# Patient Record
Sex: Female | Born: 1935 | Race: White | Hispanic: No | Marital: Married | Smoking: Former smoker
Health system: Southern US, Community
[De-identification: ages and names within clinical notes are randomized; demographics above are authoritative.]

## PROBLEM LIST (undated history)

## (undated) DIAGNOSIS — I10 Essential (primary) hypertension: Secondary | ICD-10-CM

## (undated) DIAGNOSIS — K219 Gastro-esophageal reflux disease without esophagitis: Secondary | ICD-10-CM

## (undated) DIAGNOSIS — Z0389 Encounter for observation for other suspected diseases and conditions ruled out: Secondary | ICD-10-CM

## (undated) DIAGNOSIS — E785 Hyperlipidemia, unspecified: Secondary | ICD-10-CM

## (undated) DIAGNOSIS — F32A Depression, unspecified: Secondary | ICD-10-CM

## (undated) DIAGNOSIS — I493 Ventricular premature depolarization: Secondary | ICD-10-CM

## (undated) DIAGNOSIS — E119 Type 2 diabetes mellitus without complications: Secondary | ICD-10-CM

## (undated) DIAGNOSIS — I499 Cardiac arrhythmia, unspecified: Secondary | ICD-10-CM

## (undated) DIAGNOSIS — E669 Obesity, unspecified: Secondary | ICD-10-CM

## (undated) DIAGNOSIS — R002 Palpitations: Secondary | ICD-10-CM

## (undated) HISTORY — PX: TOTAL ABDOMINAL HYSTERECTOMY: SHX209

## (undated) HISTORY — DX: Hyperlipidemia, unspecified: E78.5

## (undated) HISTORY — DX: Type 2 diabetes mellitus without complications: E11.9

## (undated) HISTORY — DX: Ventricular premature depolarization: I49.3

## (undated) HISTORY — DX: Essential (primary) hypertension: I10

## (undated) HISTORY — DX: Palpitations: R00.2

## (undated) HISTORY — DX: Encounter for observation for other suspected diseases and conditions ruled out: Z03.89

## (undated) HISTORY — DX: Obesity, unspecified: E66.9

## (undated) HISTORY — PX: CARPAL TUNNEL RELEASE: SHX101

---

## 2007-11-13 ENCOUNTER — Ambulatory Visit: Payer: Self-pay | Admitting: Cardiology

## 2007-11-13 ENCOUNTER — Encounter: Payer: Self-pay | Admitting: Cardiology

## 2007-11-19 ENCOUNTER — Encounter: Payer: Self-pay | Admitting: Cardiology

## 2007-12-15 ENCOUNTER — Encounter: Payer: Self-pay | Admitting: Cardiology

## 2007-12-31 ENCOUNTER — Encounter: Payer: Self-pay | Admitting: Physician Assistant

## 2007-12-31 ENCOUNTER — Ambulatory Visit: Payer: Self-pay | Admitting: Cardiology

## 2008-01-07 ENCOUNTER — Ambulatory Visit: Payer: Self-pay | Admitting: Cardiology

## 2008-01-15 ENCOUNTER — Ambulatory Visit: Payer: Self-pay | Admitting: Cardiology

## 2008-02-20 ENCOUNTER — Ambulatory Visit: Payer: Self-pay | Admitting: Cardiology

## 2008-10-06 ENCOUNTER — Encounter: Payer: Self-pay | Admitting: Physician Assistant

## 2008-10-06 ENCOUNTER — Ambulatory Visit: Payer: Self-pay | Admitting: Cardiology

## 2009-01-24 DIAGNOSIS — E785 Hyperlipidemia, unspecified: Secondary | ICD-10-CM | POA: Insufficient documentation

## 2009-01-24 DIAGNOSIS — R002 Palpitations: Secondary | ICD-10-CM | POA: Insufficient documentation

## 2009-01-24 DIAGNOSIS — E783 Hyperchylomicronemia: Secondary | ICD-10-CM | POA: Insufficient documentation

## 2009-03-15 ENCOUNTER — Ambulatory Visit: Payer: Self-pay | Admitting: Cardiology

## 2009-03-15 DIAGNOSIS — E119 Type 2 diabetes mellitus without complications: Secondary | ICD-10-CM

## 2009-05-13 ENCOUNTER — Encounter: Payer: Self-pay | Admitting: Cardiology

## 2010-04-21 ENCOUNTER — Encounter: Payer: Self-pay | Admitting: Cardiology

## 2010-04-21 ENCOUNTER — Ambulatory Visit: Payer: Self-pay | Admitting: Cardiology

## 2010-06-08 NOTE — Assessment & Plan Note (Signed)
Summary: 1 YR FU PER NOV REMINDER   Visit Type:  Follow-up Primary Provider:  Sherril Croon   History of Present Illness: the patient is a 77 year old female with a history of frequent palpitations. She has normal LV systolic function. She reports that her palpitations have improved with Cardizem. She denies any chest pain shortness of breath orthopnea PND. Her echocardiogram was within normal limits today. She reports no other cardiovascular related problems.  Preventive Screening-Counseling & Management  Alcohol-Tobacco     Smoking Status: quit     Year Quit: 1985  Current Medications (verified): 1)  Cardizem Cd 240 Mg Xr24h-Cap (Diltiazem Hcl Coated Beads) .... Take 1 Tablet By Mouth Once A Day 2)  Metoprolol Tartrate 100 Mg Tabs (Metoprolol Tartrate) .... Take 1 Tablet By Mouth Two Times A Day 3)  Aspirin 81 Mg Tbec (Aspirin) .... Take 1 Tablet By Mouth Once A Day 4)  Amaryl 4 Mg Tabs (Glimepiride) .... Take 1 Tablet By Mouth Two Times A Day 5)  Metformin Hcl 500 Mg Tabs (Metformin Hcl) .... Take 1 Tablet By Mouth Three Times A Day 6)  Prilosec 20 Mg Cpdr (Omeprazole) .... Take 1 Tablet By Mouth Once A Day 7)  Diovan Hct 160-12.5 Mg Tabs (Valsartan-Hydrochlorothiazide) .... Take 1 Tablet By Mouth Once A Day 8)  Gabapentin 300 Mg Caps (Gabapentin) .... Take 1 Tablet By Mouth Two Times A Day  Allergies (verified): No Known Drug Allergies  Comments:  Nurse/Medical Assistant: The patient's medication list and allergies were reviewed with the patient and were updated in the Medication and Allergy Lists.  Past History:  Past Medical History: Last updated: 03/15/2009 HYPERLIPIDEMIA-MIXED (ICD-272.4) HYPERLIPIDEMIA TYPE I / IV (ICD-272.3) PALPITATIONS (ICD-785.1)   1. Longstanding palpitations.     a.     Documented history of PACs and PVCs. 2. Normal left ventricular function. 3. Type 2 diabetes mellitus. 4. Hypertension. 5. Mixed dyslipidemia. 6. Obesity.  Family History: Last  updated: 03/15/2009 noncontributory  Social History: Last updated: 01/24/2009 Tobacco Use - Former.   Risk Factors: Smoking Status: quit (04/21/2010)  Review of Systems       The patient complains of palpitations.  The patient denies fatigue, malaise, fever, weight gain/loss, vision loss, decreased hearing, hoarseness, chest pain, shortness of breath, prolonged cough, wheezing, sleep apnea, coughing up blood, abdominal pain, blood in stool, nausea, vomiting, diarrhea, heartburn, incontinence, blood in urine, muscle weakness, joint pain, leg swelling, rash, skin lesions, headache, fainting, dizziness, depression, anxiety, enlarged lymph nodes, easy bruising or bleeding, and environmental allergies.    Vital Signs:  Patient profile:   77 year old female Height:      62 inches Weight:      197 pounds BMI:     36.16 Pulse rate:   71 / minute BP sitting:   114 / 79  (left arm) Cuff size:   large  Vitals Entered By: Carlye Grippe (April 21, 2010 10:36 AM)  Nutrition Counseling: Patient's BMI is greater than 25 and therefore counseled on weight management options.  Physical Exam  Additional Exam:  General: Well-developed, well-nourished in no distress head: Normocephalic and atraumatic eyes PERRLA/EOMI intact, conjunctiva and lids normal nose: No deformity or lesions mouth normal dentition, normal posterior pharynx neck: Supple, no JVD.  No masses, thyromegaly or abnormal cervical nodes lungs: Normal breath sounds bilaterally without wheezing.  Normal percussion heart: regular rate and rhythm with normal S1 and S2, no S3 or S4.  PMI is normal.  No pathological murmurs abdomen: Normal  bowel sounds, abdomen is soft and nontender without masses, organomegaly or hernias noted.  No hepatosplenomegaly musculoskeletal: Back normal, normal gait muscle strength and tone normal pulsus: Pulse is normal in all 4 extremities Extremities: No peripheral pitting edema neurologic: Alert and  oriented x 3 skin: Intact without lesions or rashes cervical nodes: No significant adenopathy psychologic: Normal affect    EKG  Procedure date:  04/21/2010  Findings:      normal sinus rhythm. Otherwise normal tracing. Heart rate 68 beats per minute.  Impression & Recommendations:  Problem # 1:  PALPITATIONS (ICD-785.1) improved on Cardizem. I changed this patient's Toprol to metoprolol tartrate 100 mg p.o. b.i.d. The following medications were removed from the medication list:    Metoprolol Tartrate 25 Mg Tabs (Metoprolol tartrate) .Marland Kitchen... Take one tablet by mouth as needed for palpitations (max = 2/day) Her updated medication list for this problem includes:    Cardizem Cd 240 Mg Xr24h-cap (Diltiazem hcl coated beads) .Marland Kitchen... Take 1 tablet by mouth once a day    Metoprolol Tartrate 100 Mg Tabs (Metoprolol tartrate) .Marland Kitchen... Take 1 tablet by mouth two times a day    Aspirin 81 Mg Tbec (Aspirin) .Marland Kitchen... Take 1 tablet by mouth once a day  Orders: EKG w/ Interpretation (93000)  Problem # 2:  DM (ICD-250.00) Assessment: Comment Only  Her updated medication list for this problem includes:    Aspirin 81 Mg Tbec (Aspirin) .Marland Kitchen... Take 1 tablet by mouth once a day    Amaryl 4 Mg Tabs (Glimepiride) .Marland Kitchen... Take 1 tablet by mouth two times a day    Metformin Hcl 500 Mg Tabs (Metformin hcl) .Marland Kitchen... Take 1 tablet by mouth three times a day    Diovan Hct 160-12.5 Mg Tabs (Valsartan-hydrochlorothiazide) .Marland Kitchen... Take 1 tablet by mouth once a day  Problem # 3:  HYPERLIPIDEMIA TYPE I / IV (ICD-272.3) patient is scheduled for blood work in January of 2012.  Patient Instructions: 1)  Change to Metoprolol Tartrate 100mg  two times a day  2)  Follow up in  1 year  Prescriptions: METOPROLOL TARTRATE 100 MG TABS (METOPROLOL TARTRATE) Take 1 tablet by mouth two times a day  #60 x 11   Entered by:   Hoover Brunette, LPN   Authorized by:   Lewayne Bunting, MD, [facility]   Signed by:   Hoover Brunette, LPN on 45/40/9811   Method  used:   Electronically to        [facility]* (retail)       [address]       [place], [place]  [address]       Ph: [phone]       Fax: [phone]   RxID:   [id number]

## 2010-09-19 NOTE — Assessment & Plan Note (Signed)
[facility]                          [facility]   NAME:Christine Caldwell, Christine Caldwell                        MRN:          [id number]  DATE:01/15/2008                            DOB:          02/06/1934    REFERRING PHYSICIAN:  Doreen Beam, MD   HISTORY OF PRESENT ILLNESS:  The patient is a 77 year old female with  history of palpitation, documented PACs and PVCs.  The patient has ruled  out for myocardial ischemia and has normal LV function.  She still  continues to bothered by frequent premature beats and palpitations.  She  is quite concerned and anxious about this.  She denies having any chest  pain, shortness of breath, orthopnea, or PND.  She reports no syncope.   MEDICATIONS:  1. Prilosec 20 mg p.o. daily.  2. Metformin 500 mg, 2 in the morning and 1 in the p.m.  3. Amaryl 4 mg 1 p.o. b.i.d.  4. Aspirin 81 mg p.o. daily.  5. Diovan and hydrochlorothiazide 160/12.5 mg p.o. daily.  6. Doxycycline 100 mg p.o. every other day.  7. Toprol 100 1-1/2 pills p.o. b.i.d.  8. Low-dose Cardizem CD 20 mg p.o. daily.   PHYSICAL EXAMINATION:  VITAL SIGNS:  Blood pressure 145/85, heart rate  78, weight is 197 pounds.  NECK:  Normal carotid upstroke and no carotid bruits.  LUNGS:  Clear breath sounds bilaterally.  HEART:  Regular rate and rhythm.  Normal S1 and S2.  No murmurs, rubs,  or gallops.  ABDOMEN:  Soft, nontender.  No rebound or guarding.  Good bowel sounds.  EXTREMITIES:  No cyanosis, clubbing, or edema.   PROBLEM LIST:  1. Palpitations with premature ventricular contractions and premature      atrial contractions.  2. Atypical chest pain.   PLAN:  1. I have adjusted the patient's medical regimen.  2. I reassured the patient about the benign nature of her      palpitations.  She will continue to follow with [place] in the next      couple months.Learta Codding, MD,FACC  Electronically Signed   GED/MedQ  DD: 01/16/2008  DT: 01/17/2008   Job #: [id number]   cc:   Doreen Beam, MD

## 2010-09-19 NOTE — Assessment & Plan Note (Signed)
[facility]                          [facility]   NAME:Christine Caldwell, Christine Caldwell                        MRN:          [id number]  DATE:12/31/2007                            DOB:          11/15/1933    CARDIOLOGIST:  Learta Codding, MD,FACC   PRIMARY CARE PHYSICIAN:  Doreen Beam, MD   REASON FOR VISIT:  Posthospitalization followup.   HISTORY OF PRESENT ILLNESS:  Christine Caldwell is a 77 year old female patient  with a history of palpitations and documented PAC and PVCs as well as  diabetes mellitus, hypertension, and dyslipidemia who was evaluated  recently at [facility] with chest pain in association with  chronic palpitations.  Her palpitations were seemingly getting worse.  She ruled out for myocardial infarction by enzymes.  An echocardiogram  demonstrated normal LV function and she was to be set up for an  outpatient stress test.  Unfortunately, the patient cancelled her stress  test secondary to recurrent palpitations.  She states that she did not  feel well enough to go through with a stress test.  In the office today,  she notes that she continues to have more and more problems with  palpitations.  She apparently had an event monitor placed by her primary  care physician recently.  We do not have the results of this as of yet.  She notes that her palpitations occur at any time.  She has associated  discomfort in her chest that she describes as a hurt.  There is no  radiation.  She denies any associated shortness of breath.  She does  note some nausea associated with this.  She also notes a headache and a  pounding in her head when this occurs.  She denies syncope or near  syncope.  She denies orthopnea, PND, or pedal edema.   CURRENT MEDICATIONS:  Prilosec 20 mg daily, Metformin 500 mg 2 in the  morning 1 in the evening, Amaryl 4 mg b.i.d., Aspirin 81 mg daily,  Toprol-XL 100 mg 2 tablets daily, Diovan/HCTZ 160/12.5 mg daily,  Doxycycline 100 mg every other day.   ALLERGIES:  No known drug allergies.   PHYSICAL EXAMINATION:  GENERAL:  She is a well-nourished, well-developed  female.  VITAL SIGNS:  Blood pressure is 130/84, pulse 77, and weight 196.8  pounds.  HEENT:  Normal.  NECK:  Without JVD.  CARDIAC:  Normal S1 and S2.  Regular rate and rhythm.  LUNGS:  Clear to auscultation bilaterally.  ABDOMEN:  Soft and nontender.  EXTREMITIES:  Without edema.  NEUROLOGIC:  She is alert and oriented x3.  Cranial nerves II through  XII are grossly intact.   DATABASE:  Recent labs drawn on December 15, 2007 revealed a glucose of  145, creatinine 0.8, total cholesterol 188, triglycerides 281, HDL 59,  LDL 73, TSH 2.56.  Hemoglobin 14.1.   ASSESSMENT/PLAN:  1. Chest pain.  This seems to be in association with her palpitations.      She does have significant cardiac risk factors.  Her last stress      test was in  2004, which was negative for ischemia.  I have      recommended that the patient proceed with stress Cardiolite testing      to rule out ischemia as a cause for her symptoms.  She is in      agreement with this.  2. Palpitations.  She has documented premature atrial contractions and      premature ventricular contractions in the past.  She continues to      note worsening palpitations.  We will try to obtain the results of      the event monitor recently placed by her primary care physician.  I      have also recommended increasing her Toprol to 100 mg 1-1/2 tablets      b.i.d.   DISPOSITION:  The patient will be brought back in followup with Dr.  Andee Lineman in the next several weeks to review her stress testing and also  assess her response to the increase in her medications.      Tereso Newcomer, PA-C  Electronically Signed      Jonelle Sidle, MD  Electronically Signed   SW/MedQ  DD: 12/31/2007  DT: 01/01/2008  Job #: [id number]   cc:   Doreen Beam, MD

## 2010-09-19 NOTE — Assessment & Plan Note (Signed)
[facility]                          [facility]   NAME:JOYCEMaraki, Christine Caldwell                        MRN:          [id number]  DATE:10/06/2008                            DOB:          August 24, 1933    PRIMARY CARDIOLOGIST:  Learta Codding, MD,FACC   REASON FOR VISIT:  Six-month followup.   Christine Caldwell presents for continued monitoring and management of  longstanding palpitations.  She has documented history of PACs and PVCs,  but no known atrial fibrillation.  She also has numerous cardiac risk  factors, but no documented history of coronary disease.  She had a  normal adequate exercise stress test last September.  She has normal  left ventricular function with no significant valvular abnormalities.   Clinically, she refers to having had intermittent palpitations  approximately 2 weeks ago, over the course of 4 days.  She has not had  any since then, but does cite associated weakness when she experiences  this.  Essentially, however, she suggests that the frequency is still  much less than it had been in years past.   Christine Caldwell has some mild exertional dyspnea, but no associated chest  discomfort.   EKG today indicates NSR at 60 bpm with normal axis and no ischemic  changes.   CURRENT MEDICATIONS:  1. Aspirin 81 daily.  2. Amaryl 4 b.i.d.  3. Metformin 1000 q.a.m./500 q.p.m.  4. Prilosec 20 daily.  5. Diovan HCT 160/12.5 daily.  6. Toprol-XL 100 b.i.d.  7. Cardizem 120 daily.   PHYSICAL EXAMINATION:  VITAL SIGNS:  Blood pressure 142/90, pulse 72,  regular, weight 195 (down 2).  GENERAL:  A 77 year old female, moderately obese, in no distress.  HEENT:  Normocephalic, atraumatic.  NECK:  Palpable carotid pulse without bruits; no JVD.  LUNGS:  Diminished breath sounds at the bases, but without crackles or  wheezes.  HEART:  Regular rate and rhythm.  No significant murmurs.  ABDOMEN:  Protuberant, nontender.  EXTREMITIES:  No edema.  NEURO:  Flat affect, but no focal deficit.   IMPRESSION:  1. Longstanding palpitations.      a.     Documented history of PACs and PVCs.  2. Normal left ventricular function.  3. Type 2 diabetes mellitus.  4. Hypertension.  5. Mixed dyslipidemia.  6. Obesity.   PLAN:  1. Adjust medications with up titration of Cardizem to 180 mg daily,      for better suppression of breakthrough palpitations.  We will also      add Lopressor 25 mg p.r.n.  2. Schedule return clinic followup with myself and Dr. Andee Lineman in 3      months.      Gene Serpe, PA-C  Electronically Signed      Learta Codding, MD,FACC  Electronically Signed   GS/MedQ  DD: 10/06/2008  DT: 10/07/2008  Job #: [id number]   cc:   Doreen Beam, MD

## 2010-09-19 NOTE — Assessment & Plan Note (Signed)
[facility]                          [facility]   NAME:Christine Caldwell, Christine Caldwell                        MRN:          [id number]  DATE:02/20/2008                            DOB:          02/17/1934    PRIMARY CARDIOLOGIST:  Learta Codding, MD, [facility]   REASON FOR VISIT:  Scheduled followup.  Please refer to Dr. Margarita Mail  recent office note of January 15, 2008, for full details.   At that time, Dr. Andee Lineman down titrated Toprol to the current dose of 100  mg b.i.d.  Her Cardizem dose was kept the same.   She reports no significant increase in the frequency of her palpitations  and, in fact, seems to suggest that this has happened only on rare  occasion since her last visit.  She otherwise continues to report no  chest pain.   [name] has had recent extensive workup, including a normal adequate  exercise stress test.  She has normal left ventricular function with no  significant valvular abnormalities.  She has no known history of  coronary artery disease.   CURRENT MEDICATIONS:  1. Aspirin 81 daily.  2. Toprol-XL 100 b.i.d.  3. Cardizem 120 daily.  4. Amaryl 4 b.i.d.  5. Metformin 1000 q.a.m./500 q.p.m.  6. Doxycycline 100 q.o.d.   PHYSICAL EXAMINATION:  VITAL SIGNS:  Blood pressure 120/77, pulse 69,  regular, weight 197.8.  GENERAL:  A 77 year old female, obese, sitting upright, in no distress.  HEENT:  Normocephalic, atraumatic.  NECK:  Palpable bilateral carotid pulses without bruits; no JVD.  LUNGS:  Clear to auscultation in all fields.  HEART:  Regular rate and rhythm.  No significant murmurs.  ABDOMEN:  Protuberant, nontender with intact bowel sounds.  EXTREMITIES:  No significant edema.  NEURO:  No focal deficit.   IMPRESSION:  1. Palpitations, currently quiescent.      a.     Well-controlled on beta-blocker/calcium channel blocker.      b.     Documented history of premature atrial contractions and       premature  ventricular contractions.  2. Normal LVF.  3. Atypical chest pain/multiple cardiac risk factors.      a.     No documented history of coronary artery disease.      b.     Recent normal exercise stress Cardiolite; EF 76%, September       2009.   PLAN:  1. Continue current regimen for long-term suppression of palpitations.  2. The patient is instructed to confer with Dr. Sherril Croon at time of her      next visit, regarding whether or not to initiate cholesterol-      lowering therapy.  Although she has no documented history of CAD,      she does have CAD equivalency secondary to diabetes mellitus.  3. Return clinic followup with myself and Dr. Andee Lineman in 6 months.      Gene Serpe, PA-C  Electronically Signed      Learta Codding, MD,FACC  Electronically Signed   GS/MedQ  DD: 02/20/2008  DT: 02/21/2008  Job #: [id number]   cc:   Doreen Beam, MD

## 2011-03-14 DIAGNOSIS — R079 Chest pain, unspecified: Secondary | ICD-10-CM

## 2011-03-21 ENCOUNTER — Encounter: Payer: Self-pay | Admitting: *Deleted

## 2011-03-21 DIAGNOSIS — R079 Chest pain, unspecified: Secondary | ICD-10-CM

## 2011-03-27 ENCOUNTER — Encounter: Payer: Self-pay | Admitting: Cardiology

## 2011-03-27 ENCOUNTER — Encounter: Payer: Self-pay | Admitting: *Deleted

## 2011-03-27 ENCOUNTER — Telehealth: Payer: Self-pay | Admitting: *Deleted

## 2011-03-27 NOTE — Telephone Encounter (Signed)
Pt missed appt at 10:45. She came into the office at 11:45 and states hospital told her appt was at that time. Pt's stress echo was normal. Discussed with Dr. Earnestine Leys who states pt can return for f/u in 3 months. Pt verbalized understanding.

## 2011-05-07 ENCOUNTER — Other Ambulatory Visit: Payer: Self-pay | Admitting: Cardiology

## 2011-06-07 ENCOUNTER — Other Ambulatory Visit: Payer: Self-pay | Admitting: *Deleted

## 2011-06-07 NOTE — Telephone Encounter (Signed)
Patient needs refill on Metoprolol 100 mg [employer]

## 2011-06-08 MED ORDER — METOPROLOL TARTRATE 100 MG PO TABS: 100.0000 mg | ORAL_TABLET | Freq: Two times a day (BID) | ORAL | Status: DC

## 2011-07-19 ENCOUNTER — Ambulatory Visit (INDEPENDENT_AMBULATORY_CARE_PROVIDER_SITE_OTHER): Payer: Medicare Other | Admitting: Cardiology

## 2011-07-19 ENCOUNTER — Encounter: Payer: Self-pay | Admitting: Cardiology

## 2011-07-19 VITALS — BP 122/64 | HR 68 | Ht 62.0 in | Wt 194.0 lb

## 2011-07-19 DIAGNOSIS — Z0389 Encounter for observation for other suspected diseases and conditions ruled out: Secondary | ICD-10-CM | POA: Insufficient documentation

## 2011-07-19 DIAGNOSIS — I4949 Other premature depolarization: Secondary | ICD-10-CM

## 2011-07-19 DIAGNOSIS — I493 Ventricular premature depolarization: Secondary | ICD-10-CM

## 2011-07-19 MED ORDER — METOPROLOL TARTRATE 100 MG PO TABS: 100.0000 mg | ORAL_TABLET | Freq: Two times a day (BID) | ORAL | Status: DC

## 2011-07-19 MED ORDER — DILTIAZEM HCL ER COATED BEADS 240 MG PO CP24: 240.0000 mg | ORAL_CAPSULE | Freq: Every day | ORAL | Status: AC

## 2011-07-19 NOTE — Patient Instructions (Addendum)
Continue all current medications. Your physician wants you to follow up in:  1 year.  You will receive a reminder letter in the mail one-two months in advance.  If you don't receive a letter, please call our office to schedule the follow up appointment   

## 2011-07-19 NOTE — Progress Notes (Signed)
Christine Bottoms, MD, [facility] Board Certified in Adult Cardiovascular Medicine,Internal Medicine and Critical Care Medicine    CC: Followup patient with palpitations  HPI:  Patient has no history of coronary artery disease. She had a stress test done last year which was within normal limits hospitalized. She still has occasional palpitations but they're short lived and she has not required any medications to abort these. She is essentially asymptomatic with those. She has no significant structural heart disease. She denies any orthopnea PND, chest pain or shortness of breath on exertion. He stable from a cardiovascular perspective.  PMH: reviewed and listed in Problem List in Electronic Records (and see below) Past Medical History  Diagnosis Date  . Other and unspecified hyperlipidemia   . Hyperlipidemia     Total cholesterol 190  . Palpitations     . Longstanding palpitations.  Documented history of PACs and PVCs.  . Type 2 diabetes mellitus   . Hypertension   . Obesity   . Coronary artery disease (CAD) excluded     Myoview stress test 2012 within normal limits, normal LV function echocardiogram  . PVC's (premature ventricular contractions)      stable   No past surgical history on file.  Allergies/SH/FHX : available in Electronic Records for review  No Known Allergies History   Social History  . Marital Status: Married    Spouse Name: N/A    Number of Children: N/A  . Years of Education: N/A   Occupational History  . Not on file.   Social History Main Topics  . Smoking status: Former Smoker -- 0.3 packs/day for 10 years    Types: Cigarettes    Quit date: 05/08/1983  . Smokeless tobacco: Never Used  . Alcohol Use: Not on file  . Drug Use: Not on file  . Sexually Active: Not on file   Other Topics Concern  . Not on file   Social History Narrative  . No narrative on file   No family history on file.  Medications: Current Outpatient Prescriptions    Medication Sig Dispense Refill  . aspirin EC 81 MG tablet Take 81 mg by mouth daily.        Marland Kitchen diltiazem (CARDIZEM CD) 240 MG 24 hr capsule Take 1 capsule (240 mg total) by mouth daily.  90 capsule  3  . gabapentin (NEURONTIN) 300 MG capsule Take 300 mg by mouth daily.       Marland Kitchen glimepiride (AMARYL) 4 MG tablet Take 4 mg by mouth 2 (two) times daily.        . metFORMIN (GLUCOPHAGE) 500 MG tablet Take 1,000 mg by mouth daily with breakfast. & 1 by mouth in evening      . metoprolol (LOPRESSOR) 100 MG tablet Take 1 tablet (100 mg total) by mouth 2 (two) times daily.  180 tablet  3  . omeprazole (PRILOSEC) 20 MG capsule Take 20 mg by mouth daily.        . valsartan-hydrochlorothiazide (DIOVAN-HCT) 160-12.5 MG per tablet Take 1 tablet by mouth daily.          ROS: No nausea or vomiting. No fever or chills.No melena or hematochezia.No bleeding.No claudication  Physical Exam: BP 122/64  Pulse 68  Ht 5\' 2"  (1.575 m)  Wt 194 lb (87.998 kg)  BMI 35.48 kg/m2 General: Well-nourished white female in no distress Neck: Normal carotid upstroke and no carotid bruits Lungs: Clear breath sounds bilaterally. No wheezing Cardiac: Regular rate and rhythm with  normal S1-S2. No murmur rubs or gallops Vascular: No edema. Normal distal pulses Skin: Warm and dry Physcologic: Normal affect  12lead ECG: Limited bedside ECHO:N/A No images are attached to the encounter.    Patient Active Problem List  Diagnoses  . DM  . HYPERLIPIDEMIA TYPE I / IV  . HYPERLIPIDEMIA-MIXED  . PALPITATIONS  . Coronary artery disease (CAD) excluded  . PVC's (premature ventricular contractions)    PLAN   Continue current medical regimen. The patient is doing quite well.  She had a stress test done last year which was within normal limits. She reports no chest pain.  Continued to remain physically active and achieved a therapeutic lifestyle changes.

## 2012-02-13 ENCOUNTER — Ambulatory Visit (INDEPENDENT_AMBULATORY_CARE_PROVIDER_SITE_OTHER): Payer: Medicare Other | Admitting: Physician Assistant

## 2012-02-13 ENCOUNTER — Telehealth: Payer: Self-pay | Admitting: Cardiology

## 2012-02-13 ENCOUNTER — Encounter: Payer: Self-pay | Admitting: Physician Assistant

## 2012-02-13 VITALS — BP 108/70 | HR 63 | Ht 62.0 in | Wt 196.8 lb

## 2012-02-13 DIAGNOSIS — E119 Type 2 diabetes mellitus without complications: Secondary | ICD-10-CM

## 2012-02-13 DIAGNOSIS — R002 Palpitations: Secondary | ICD-10-CM

## 2012-02-13 DIAGNOSIS — I10 Essential (primary) hypertension: Secondary | ICD-10-CM | POA: Insufficient documentation

## 2012-02-13 NOTE — Telephone Encounter (Signed)
Will be following [facility] after today

## 2012-02-13 NOTE — Assessment & Plan Note (Signed)
Followed by primary M.D. 

## 2012-02-13 NOTE — Patient Instructions (Signed)
   7 Day heart monitor - will be mailed to your home  Labs:  BMET, Magnesium, TSH  Office will contact with results Continue all current medications. Follow up in  2 weeks

## 2012-02-13 NOTE — Assessment & Plan Note (Signed)
Currently low normal, despite her not having taken any of her antihypertensives this morning. Will make further recommendations at time of next OV, following review of monitor results.

## 2012-02-13 NOTE — Assessment & Plan Note (Signed)
Patient has agreed to wear a continuous event monitor for one week, followed by early return visit with me for review of results and further recommendations. Regarding her current medication regimen, I am unable to up titrate either the Cardizem or metoprolol, secondary to relative hypotension. Of note, patient states that she did not take her medications this a.m. If necessary, we may need to cut back on her valsartan dose, so as to allow increase in either of her 2 rate controlling medications.

## 2012-02-13 NOTE — Progress Notes (Signed)
Primary Cardiologist: Lewayne Bunting, MD   HPI: Patient seen as an add-on for worsening palpitations. Last seen here in clinic in March, by Dr. Andee Lineman.  Patient suggests recent progression of long-standing history of palpitations, with history of previously documented PACs and PVCs. These occur near daily, and are associated with significant weakness, but no frank syncope. She can have several episodes a day, sometimes lasting several hours in duration.  12-lead EKG today, reviewed by me, indicates NSR 63 bpm; no ischemic changes.  No Known Allergies  Current Outpatient Prescriptions  Medication Sig Dispense Refill  . aspirin EC 81 MG tablet Take 81 mg by mouth daily.        Marland Kitchen diltiazem (CARDIZEM CD) 240 MG 24 hr capsule Take 1 capsule (240 mg total) by mouth daily.  90 capsule  3  . gabapentin (NEURONTIN) 300 MG capsule Take 300 mg by mouth daily.       Marland Kitchen glimepiride (AMARYL) 4 MG tablet Take 4 mg by mouth 2 (two) times daily.        . metFORMIN (GLUCOPHAGE) 500 MG tablet Take 500 mg by mouth 2 (two) times daily with a meal. & 1 by mouth in evening      . metoprolol (LOPRESSOR) 100 MG tablet Take 1 tablet (100 mg total) by mouth 2 (two) times daily.  180 tablet  3  . omeprazole (PRILOSEC) 20 MG capsule Take 20 mg by mouth daily.        . ONGLYZA 5 MG TABS tablet Take 5 mg by mouth daily.       . valsartan-hydrochlorothiazide (DIOVAN-HCT) 160-12.5 MG per tablet Take 1 tablet by mouth daily.          Past Medical History  Diagnosis Date  . Other and unspecified hyperlipidemia   . Hyperlipidemia     Total cholesterol 190  . Palpitations     . Longstanding palpitations.  Documented history of PACs and PVCs.  . Type 2 diabetes mellitus   . Hypertension   . Obesity   . Coronary artery disease (CAD) excluded     Myoview stress test 2012 within normal limits, normal LV function echocardiogram  . PVC's (premature ventricular contractions)      stable    No past surgical history on  file.  History   Social History  . Marital Status: Married    Spouse Name: N/A    Number of Children: N/A  . Years of Education: N/A   Occupational History  . Not on file.   Social History Main Topics  . Smoking status: Former Smoker -- 0.3 packs/day for 10 years    Types: Cigarettes    Quit date: 05/08/1983  . Smokeless tobacco: Never Used  . Alcohol Use: Not on file  . Drug Use: Not on file  . Sexually Active: Not on file   Other Topics Concern  . Not on file   Social History Narrative  . No narrative on file    No family history on file.  ROS: no nausea, vomiting; no fever, chills; no melena, hematochezia; no claudication  PHYSICAL EXAM: BP 108/70  Pulse 63  Ht 5\' 2"  (1.575 m)  Wt 196 lb 12.8 oz (89.268 kg)  BMI 36.00 kg/m2 GENERAL: 78 year old female, obese; NAD HEENT: NCAT, PERRLA, EOMI; sclera clear; no xanthelasma NECK: palpable bilateral carotid pulses, no bruits; unable to assess JVD, secondary to neck girth; no TM LUNGS: CTA bilaterally CARDIAC: RRR (S1, S2); no significant murmurs; no rubs or gallops  ABDOMEN: Protuberant EXTREMETIES: no significant peripheral edema SKIN: warm/dry; no obvious rash/lesions MUSCULOSKELETAL: no joint deformity NEURO: no focal deficit; NL affect   EKG: reviewed and available in Electronic Records   ASSESSMENT & PLAN:  PALPITATIONS Patient has agreed to wear a continuous event monitor for one week, followed by early return visit with me for review of results and further recommendations. Regarding her current medication regimen, I am unable to up titrate either the Cardizem or metoprolol, secondary to relative hypotension. Of note, patient states that she did not take her medications this a.m. If necessary, we may need to cut back on her valsartan dose, so as to allow increase in either of her 2 rate controlling medications.  HTN (hypertension) Currently low normal, despite her not having taken any of her  antihypertensives this morning. Will make further recommendations at time of next OV, following review of monitor results.  DM Followed by primary M.D.    Gene Naveen Lorusso, PAC

## 2012-02-14 ENCOUNTER — Other Ambulatory Visit: Payer: Self-pay | Admitting: *Deleted

## 2012-02-14 DIAGNOSIS — R002 Palpitations: Secondary | ICD-10-CM

## 2012-02-20 DIAGNOSIS — R002 Palpitations: Secondary | ICD-10-CM

## 2012-02-22 ENCOUNTER — Encounter: Payer: Self-pay | Admitting: *Deleted

## 2012-03-14 ENCOUNTER — Ambulatory Visit: Payer: Medicare Other | Admitting: Physician Assistant

## 2012-03-21 ENCOUNTER — Encounter: Payer: Self-pay | Admitting: Physician Assistant

## 2012-03-21 ENCOUNTER — Ambulatory Visit (INDEPENDENT_AMBULATORY_CARE_PROVIDER_SITE_OTHER): Payer: Medicare Other | Admitting: Physician Assistant

## 2012-03-21 VITALS — BP 130/80 | HR 73 | Ht 62.0 in | Wt 199.8 lb

## 2012-03-21 DIAGNOSIS — I10 Essential (primary) hypertension: Secondary | ICD-10-CM

## 2012-03-21 DIAGNOSIS — R002 Palpitations: Secondary | ICD-10-CM

## 2012-03-21 NOTE — Patient Instructions (Addendum)
Continue all current medications. Your physician wants you to follow up in: 6 months.  You will receive a reminder letter in the mail one-two months in advance.  If you don't receive a letter, please call our office to schedule the follow up appointment   

## 2012-03-21 NOTE — Progress Notes (Signed)
Primary Cardiologist: Lewayne Bunting, MD   HPI: Patient presents for early scheduled followup and review of recent seven-day Event monitor, for ongoing evaluation of palpitations.   - Event monitor: NSR, no definite atrial fibrillation   - Labs notable for normal potassium, magnesium, and TSH  Clinically, she reports no significant palpitations since last OV, and during the period she wore the event monitor.  No Known Allergies  Current Outpatient Prescriptions  Medication Sig Dispense Refill  . aspirin EC 81 MG tablet Take 81 mg by mouth daily.        Marland Kitchen diltiazem (CARDIZEM CD) 240 MG 24 hr capsule Take 1 capsule (240 mg total) by mouth daily.  90 capsule  3  . gabapentin (NEURONTIN) 300 MG capsule Take 300 mg by mouth daily.       Marland Kitchen glimepiride (AMARYL) 4 MG tablet Take 4 mg by mouth 2 (two) times daily.        . metFORMIN (GLUCOPHAGE) 500 MG tablet Take 500 mg by mouth 2 (two) times daily with a meal. & 1 by mouth in evening      . metoprolol (LOPRESSOR) 100 MG tablet Take 1 tablet (100 mg total) by mouth 2 (two) times daily.  180 tablet  3  . omeprazole (PRILOSEC) 20 MG capsule Take 20 mg by mouth daily.        . ONGLYZA 5 MG TABS tablet Take 5 mg by mouth daily.       . valsartan-hydrochlorothiazide (DIOVAN-HCT) 160-12.5 MG per tablet Take 1 tablet by mouth daily.          Past Medical History  Diagnosis Date  . Other and unspecified hyperlipidemia   . Hyperlipidemia     Total cholesterol 190  . Palpitations     . Longstanding palpitations.  Documented history of PACs and PVCs.  . Type 2 diabetes mellitus   . Hypertension   . Obesity   . Coronary artery disease (CAD) excluded     Myoview stress test 2012 within normal limits, normal LV function echocardiogram  . PVC's (premature ventricular contractions)      stable    No past surgical history on file.  History   Social History  . Marital Status: Married    Spouse Name: N/A    Number of Children: N/A  . Years of  Education: N/A   Occupational History  . Not on file.   Social History Main Topics  . Smoking status: Former Smoker -- 0.3 packs/day for 10 years    Types: Cigarettes    Quit date: 05/08/1983  . Smokeless tobacco: Never Used  . Alcohol Use: Not on file  . Drug Use: Not on file  . Sexually Active: Not on file   Other Topics Concern  . Not on file   Social History Narrative  . No narrative on file    No family history on file.  ROS: no nausea, vomiting; no fever, chills; no melena, hematochezia; no claudication  PHYSICAL EXAM: BP 130/80  Pulse 73  Ht 5\' 2"  (1.575 m)  Wt 199 lb 12.8 oz (90.629 kg)  BMI 36.54 kg/m2  SpO2 98% GENERAL: 78 year old female, obese; NAD  HEENT: NCAT, PERRLA, EOMI; sclera clear; no xanthelasma  NECK: palpable bilateral carotid pulses, no bruits; unable to assess JVD, secondary to neck girth; no TM  LUNGS: CTA bilaterally  CARDIAC: RRR (S1, S2); no significant murmurs; no rubs or gallops  ABDOMEN: Protuberant  EXTREMETIES: no significant peripheral edema  SKIN: warm/dry;  no obvious rash/lesions  MUSCULOSKELETAL: no joint deformity  NEURO: no focal deficit; NL affect    EKG:    ASSESSMENT & PLAN:  PALPITATIONS No further workup indicated. Patient has expressed her desire to resume followup with Dr. Andee Lineman.  HTN (hypertension) Followed by primary M.D.    Gene Tarrin Menn, PAC

## 2012-03-21 NOTE — Assessment & Plan Note (Signed)
Followed by primary M.D. 

## 2012-03-21 NOTE — Assessment & Plan Note (Signed)
No further workup indicated. Patient has expressed her desire to resume followup with Dr. Andee Lineman.

## 2012-09-30 ENCOUNTER — Other Ambulatory Visit: Payer: Self-pay | Admitting: Cardiology

## 2012-10-09 ENCOUNTER — Ambulatory Visit (INDEPENDENT_AMBULATORY_CARE_PROVIDER_SITE_OTHER): Payer: Medicare Other | Admitting: Physician Assistant

## 2012-10-09 ENCOUNTER — Encounter: Payer: Self-pay | Admitting: Physician Assistant

## 2012-10-09 VITALS — BP 123/78 | HR 75 | Ht 62.0 in | Wt 198.0 lb

## 2012-10-09 DIAGNOSIS — R002 Palpitations: Secondary | ICD-10-CM

## 2012-10-09 DIAGNOSIS — R0989 Other specified symptoms and signs involving the circulatory and respiratory systems: Secondary | ICD-10-CM

## 2012-10-09 MED ORDER — METOPROLOL TARTRATE 100 MG PO TABS: 100.0000 mg | ORAL_TABLET | Freq: Two times a day (BID) | ORAL | Status: DC

## 2012-10-09 NOTE — Assessment & Plan Note (Signed)
No further workup currently indicated. Patient has no known CAD, with prior history of multiple negative stress tests, most recently a NL dobutamine stress echocardiogram in November 2012. She presents with no exacerbation from her baseline exercise tolerance level. Will plan for her to return in one year, or sooner if she were to develop worsening DOE or worsening tachycardia palpitations.

## 2012-10-09 NOTE — Assessment & Plan Note (Signed)
Very well controlled on current medication regimen. In fact, she states that this is the best she has felt "in years". When last seen, we reviewed a 1 week event monitor which yielded NSR with no definite atrial fibrillation. She has a long-standing history of palpitations, with previously documented PACs/PVCs, but no definite atrial fibrillation.

## 2012-10-09 NOTE — Patient Instructions (Addendum)
Continue all current medications. Your physician wants you to follow up in:  1 year.  You will receive a reminder letter in the mail one-two months in advance.  If you don't receive a letter, please call our office to schedule the follow up appointment   

## 2012-10-09 NOTE — Progress Notes (Signed)
Primary Cardiologist: Simona Huh, MD (new)   HPI: 79 year old female, formally followed by Dr. Andee Lineman and last seen here in clinic in November 2013, now returns for scheduled six-month followup.   Cardiac history notable for long-standing palpitations with documented PACs/PVCs, but no atrial fibrillation. She also has multiple CRFs, with no documented CAD, and a NL Myoview in 2012.  When last seen, we discussed the results of a one week event monitor, placed for ongoing evaluation of palpitations, which yielded NSR with no definite evidence of atrial fibrillation. Electrolytes and TSH were NL.  She returns today reporting that this is the best she has felt "in years". She now is experiencing palpitations only on rare occasions. She continues to remain quite active, climbing the stairs in her basement several times a day, with no associated chest discomfort. She has mild, stable DOE, with no interim exacerbation.  No Known Allergies  Current Outpatient Prescriptions  Medication Sig Dispense Refill  . aspirin EC 81 MG tablet Take 81 mg by mouth daily.        Marland Kitchen diltiazem (CARDIZEM CD) 240 MG 24 hr capsule Take 1 capsule (240 mg total) by mouth daily.  90 capsule  3  . gabapentin (NEURONTIN) 300 MG capsule Take 300 mg by mouth daily.       Marland Kitchen glimepiride (AMARYL) 4 MG tablet Take 4 mg by mouth 2 (two) times daily.        . metFORMIN (GLUCOPHAGE) 500 MG tablet Take 500 mg by mouth 2 (two) times daily with a meal. & 1 by mouth in evening      . metoprolol (LOPRESSOR) 100 MG tablet TAKE (1) TABLET TWICE DAILY.  60 tablet  0  . omeprazole (PRILOSEC) 20 MG capsule Take 20 mg by mouth daily.        . ONGLYZA 5 MG TABS tablet Take 5 mg by mouth daily.       . valsartan-hydrochlorothiazide (DIOVAN-HCT) 160-12.5 MG per tablet Take 1 tablet by mouth daily.         No current facility-administered medications for this visit.    Past Medical History  Diagnosis Date  . Other and unspecified  hyperlipidemia   . Hyperlipidemia     Total cholesterol 190  . Palpitations     . Longstanding palpitations.  Documented history of PACs and PVCs.  . Type 2 diabetes mellitus   . Hypertension   . Obesity   . Coronary artery disease (CAD) excluded     Myoview stress test 2012 within normal limits, normal LV function echocardiogram  . PVC's (premature ventricular contractions)      stable    No past surgical history on file.  History   Social History  . Marital Status: Married    Spouse Name: N/A    Number of Children: N/A  . Years of Education: N/A   Occupational History  . Not on file.   Social History Main Topics  . Smoking status: Former Smoker -- 0.30 packs/day for 10 years    Types: Cigarettes    Quit date: 05/08/1983  . Smokeless tobacco: Never Used  . Alcohol Use: Not on file  . Drug Use: Not on file  . Sexually Active: Not on file   Other Topics Concern  . Not on file   Social History Narrative  . No narrative on file    No family history on file.  ROS: no nausea, vomiting; no fever, chills; no melena, hematochezia; no claudication  PHYSICAL EXAM:  BP 123/78  Pulse 75  Ht 5\' 2"  (1.575 m)  Wt 198 lb (89.812 kg)  BMI 36.21 kg/m2 GENERAL: 79 year old female, moderately obese; NAD HEENT: NCAT, PERRLA, EOMI; sclera clear; no xanthelasma NECK: palpable bilateral carotid pulses, no bruits; no JVD; no TM LUNGS: CTA bilaterally CARDIAC: RRR (S1, S2); no significant murmurs; no rubs or gallops ABDOMEN: soft, protuberant EXTREMETIES: no significant peripheral edema SKIN: warm/dry; no obvious rash/lesions MUSCULOSKELETAL: no joint deformity NEURO: no focal deficit; NL affect   EKG:    ASSESSMENT & PLAN:  Palpitations Very well controlled on current medication regimen. In fact, she states that this is the best she has felt "in years". When last seen, we reviewed a 1 week event monitor which yielded NSR with no definite atrial fibrillation. She has a  long-standing history of palpitations, with previously documented PACs/PVCs, but no definite atrial fibrillation.  Exertional dyspnea No further workup currently indicated. Patient has no known CAD, with prior history of multiple negative stress tests, most recently a NL dobutamine stress echocardiogram in November 2012. She presents with no exacerbation from her baseline exercise tolerance level. Will plan for her to return in one year, or sooner if she were to develop worsening DOE or worsening tachycardia palpitations.    Gene Cairo Lingenfelter, PAC

## 2012-11-01 ENCOUNTER — Other Ambulatory Visit: Payer: Self-pay | Admitting: Physician Assistant

## 2016-11-09 ENCOUNTER — Encounter: Payer: Self-pay | Admitting: *Deleted

## 2016-11-13 ENCOUNTER — Encounter: Payer: Self-pay | Admitting: Cardiovascular Disease

## 2016-11-13 ENCOUNTER — Ambulatory Visit (INDEPENDENT_AMBULATORY_CARE_PROVIDER_SITE_OTHER): Payer: Medicare Other | Admitting: Cardiovascular Disease

## 2016-11-13 VITALS — BP 91/63 | HR 77 | Ht 62.0 in | Wt 179.4 lb

## 2016-11-13 DIAGNOSIS — R002 Palpitations: Secondary | ICD-10-CM | POA: Diagnosis not present

## 2016-11-13 DIAGNOSIS — Z79899 Other long term (current) drug therapy: Secondary | ICD-10-CM | POA: Diagnosis not present

## 2016-11-13 DIAGNOSIS — I10 Essential (primary) hypertension: Secondary | ICD-10-CM | POA: Diagnosis not present

## 2016-11-13 NOTE — Progress Notes (Signed)
CARDIOLOGY CONSULT NOTE  Patient ID: Christine Caldwell MRN: [id number] DOB/AGE: [birth year] 83 y.o.  Admit date: (Not on file) Primary Physician: Christine Chroman, MD Referring Physician: Woody Caldwell  Reason for Consultation: palpitations  HPI: Christine Caldwell is a 83 y.o. female who is being seen today for the evaluation of palpitations at the request of Vyas, Dhruv B, MD.   She was last evaluated by cardiology in June 2014. As per review electronic medical record, she has a long-standing history of palpitations with documented PACs and PVCs but no atrial fibrillation. She had a normal dobutamine nuclear stress test in 2012.  ECG performed in the office today which I ordered and personally interpreted demonstrates normal sinus rhythm with no ischemic ST segment or T-wave abnormalities, nor any arrhythmias.  The patient denies any symptoms of chest pain, palpitations, shortness of breath, lightheadedness, dizziness, leg swelling, orthopnea, PND, and syncope.  She does not do any exercising but would like to start walking.  She comes today to discuss her medications and whether she can come off any of them.     No Known Allergies  Current Outpatient Prescriptions  Medication Sig Dispense Refill  . aspirin EC 81 MG tablet Take 81 mg by mouth daily.      Marland Kitchen diltiazem (CARDIZEM CD) 240 MG 24 hr capsule Take 1 capsule (240 mg total) by mouth daily. 90 capsule 3  . gabapentin (NEURONTIN) 300 MG capsule Take 300 mg by mouth daily.     Marland Kitchen glimepiride (AMARYL) 4 MG tablet Take 4 mg by mouth 2 (two) times daily.      . metFORMIN (GLUCOPHAGE) 500 MG tablet Take 500 mg by mouth 2 (two) times daily with a meal. & 1 by mouth in evening    . metoprolol (LOPRESSOR) 100 MG tablet Take 1 tablet (100 mg total) by mouth 2 (two) times daily. 180 tablet 3  . metoprolol (LOPRESSOR) 100 MG tablet TAKE 1 TABLET TWICE DAILY. MUST KEEP APPT. ON 10/09/12 60 tablet 6  . omeprazole (PRILOSEC) 20 MG capsule Take 20  mg by mouth daily.      . ONGLYZA 5 MG TABS tablet Take 5 mg by mouth daily.     . valsartan-hydrochlorothiazide (DIOVAN-HCT) 160-12.5 MG per tablet Take 1 tablet by mouth daily.       No current facility-administered medications for this visit.     Past Medical History:  Diagnosis Date  . Coronary artery disease (CAD) excluded    Myoview stress test 2012 within normal limits, normal LV function echocardiogram  . Hyperlipidemia    Total cholesterol 190  . Hypertension   . Obesity   . Other and unspecified hyperlipidemia   . Palpitations    . Longstanding palpitations.  Documented history of PACs and PVCs.  . PVC's (premature ventricular contractions)     stable  . Type 2 diabetes mellitus ([place])     Past Surgical History:  Procedure Laterality Date  . TOTAL ABDOMINAL HYSTERECTOMY      Social History   Social History  . Marital status: Married    Spouse name: N/A  . Number of children: N/A  . Years of education: N/A   Occupational History  . Not on file.   Social History Main Topics  . Smoking status: Former Smoker    Packs/day: 0.30    Years: 10.00    Types: Cigarettes    Quit date: 05/08/1983  . Smokeless tobacco: Never Used  .  Alcohol use Not on file  . Drug use: Unknown  . Sexual activity: Not on file   Other Topics Concern  . Not on file   Social History Narrative  . No narrative on file     No family history of premature CAD in 1st degree relatives.  No outpatient prescriptions have been marked as taking for the 11/13/16 encounter (Office Visit) with Christine Commons, MD.      Review of systems complete and found to be negative unless listed above in HPI    Physical exam Blood pressure 91/63, pulse 77, height 5\' 2"  (1.575 m), weight 179 lb 6.4 oz (81.4 kg). General: NAD Neck: No JVD, no thyromegaly or thyroid nodule.  Lungs: Clear to auscultation bilaterally with normal respiratory effort. CV: Nondisplaced PMI. Regular rate and rhythm,  normal S1/S2, no S3/S4, no murmur.  No peripheral edema.  No carotid bruit.    Abdomen: Soft, nontender, no distention.  Skin: Intact without lesions or rashes.  Neurologic: Alert and oriented x 3.  Psych: Normal affect. Extremities: No clubbing or cyanosis.  HEENT: Normal.   ECG: Most recent ECG reviewed.   Labs: No results found for: K, BUN, CREATININE, ALT, TSH, HGB   Lipids: No results found for: LDLCALC, LDLDIRECT, CHOL, TRIG, HDL      ASSESSMENT AND PLAN:  1. Palpitations: She is on long-acting diltiazem 240 mg daily and metoprolol tartrate 100 mg twice daily. I will stop metoprolol altogether. I told her it would be normal to expect some palpitations over the next several days but this should resolve as metoprolol is excreted from her system. Diltiazem dose can be reduced in the future but I will defer this to PCP.  2. Hypertension: Low normal on present therapy.  I will stop metoprolol altogether.     Disposition: Follow up prn  Signed: Kate Caldwell, M.D., F.A.C.C.  11/13/2016, 2:44 PM

## 2016-11-13 NOTE — Patient Instructions (Signed)
Your physician recommends that you schedule a follow-up appointment in: AS NEEDED WITH DR Krakow physician has recommended you make the following change in your medication:   STOP METOPROLOL   Thank you for choosing [facility]!!

## 2016-11-26 ENCOUNTER — Telehealth: Payer: Self-pay | Admitting: Cardiovascular Disease

## 2016-11-26 DIAGNOSIS — R002 Palpitations: Secondary | ICD-10-CM

## 2016-11-26 NOTE — Telephone Encounter (Signed)
Patient states since coming off metoprolol she has been very weak and unable to perform  ADL's without becoming extremely weak and short of breath. Getting up to answer the phone, patient could barely catch her breath. Symptoms started about a week ago. No other symptoms. Advised patient if symptoms worsen she needs to go to the ER.

## 2016-11-26 NOTE — Telephone Encounter (Signed)
Patient called stating that she stopped taking metoprolol (LOPRESSOR) 100 MG ta  on July 11th. Since then she is so fatigue ( no energy)   Please call [phone].

## 2016-11-27 NOTE — Telephone Encounter (Signed)
LM to return call.

## 2016-11-27 NOTE — Telephone Encounter (Signed)
Stopping metoprolol would not cause these symptoms unless she was having a significant abnormal heart rhythm. She needs to contact her pcp for evaluation. From our standpoint I would recommend a 1 week event monitor for palpitations.    Carlyle Dolly MD

## 2016-11-28 NOTE — Telephone Encounter (Signed)
Pt agreeable to wear event monitor - says she wants to remain off the metoprolol - orders placed and enrolled pt - updated medication list

## 2016-12-03 ENCOUNTER — Ambulatory Visit (INDEPENDENT_AMBULATORY_CARE_PROVIDER_SITE_OTHER): Payer: Medicare Other

## 2016-12-03 DIAGNOSIS — R002 Palpitations: Secondary | ICD-10-CM | POA: Diagnosis not present

## 2016-12-21 ENCOUNTER — Telehealth: Payer: Self-pay | Admitting: *Deleted

## 2016-12-21 NOTE — Telephone Encounter (Signed)
-----   Message from Lendon Colonel, NP sent at 12/21/2016  7:54 AM EDT ----- Monitor did not show significant abnormalities. No changes in regimen.

## 2016-12-21 NOTE — Telephone Encounter (Signed)
Called patient with test results. No answer. Left message to call back.  

## 2019-05-11 DIAGNOSIS — Z299 Encounter for prophylactic measures, unspecified: Secondary | ICD-10-CM | POA: Diagnosis not present

## 2019-05-11 DIAGNOSIS — E113292 Type 2 diabetes mellitus with mild nonproliferative diabetic retinopathy without macular edema, left eye: Secondary | ICD-10-CM | POA: Diagnosis not present

## 2019-05-11 DIAGNOSIS — I10 Essential (primary) hypertension: Secondary | ICD-10-CM | POA: Diagnosis not present

## 2019-05-11 DIAGNOSIS — E1165 Type 2 diabetes mellitus with hyperglycemia: Secondary | ICD-10-CM | POA: Diagnosis not present

## 2019-05-28 DIAGNOSIS — W19XXXA Unspecified fall, initial encounter: Secondary | ICD-10-CM | POA: Diagnosis not present

## 2019-05-28 DIAGNOSIS — M545 Low back pain: Secondary | ICD-10-CM | POA: Diagnosis not present

## 2019-06-01 DIAGNOSIS — E119 Type 2 diabetes mellitus without complications: Secondary | ICD-10-CM | POA: Diagnosis not present

## 2019-06-19 DIAGNOSIS — E7800 Pure hypercholesterolemia, unspecified: Secondary | ICD-10-CM | POA: Diagnosis not present

## 2019-06-19 DIAGNOSIS — E119 Type 2 diabetes mellitus without complications: Secondary | ICD-10-CM | POA: Diagnosis not present

## 2019-06-19 DIAGNOSIS — I10 Essential (primary) hypertension: Secondary | ICD-10-CM | POA: Diagnosis not present

## 2019-07-05 DIAGNOSIS — E119 Type 2 diabetes mellitus without complications: Secondary | ICD-10-CM | POA: Diagnosis not present

## 2019-07-16 DIAGNOSIS — E7800 Pure hypercholesterolemia, unspecified: Secondary | ICD-10-CM | POA: Diagnosis not present

## 2019-07-16 DIAGNOSIS — E119 Type 2 diabetes mellitus without complications: Secondary | ICD-10-CM | POA: Diagnosis not present

## 2019-07-16 DIAGNOSIS — I10 Essential (primary) hypertension: Secondary | ICD-10-CM | POA: Diagnosis not present

## 2019-08-04 DIAGNOSIS — E119 Type 2 diabetes mellitus without complications: Secondary | ICD-10-CM | POA: Diagnosis not present

## 2019-08-18 DIAGNOSIS — E113292 Type 2 diabetes mellitus with mild nonproliferative diabetic retinopathy without macular edema, left eye: Secondary | ICD-10-CM | POA: Diagnosis not present

## 2019-08-18 DIAGNOSIS — E1122 Type 2 diabetes mellitus with diabetic chronic kidney disease: Secondary | ICD-10-CM | POA: Diagnosis not present

## 2019-08-18 DIAGNOSIS — Z299 Encounter for prophylactic measures, unspecified: Secondary | ICD-10-CM | POA: Diagnosis not present

## 2019-08-18 DIAGNOSIS — E1165 Type 2 diabetes mellitus with hyperglycemia: Secondary | ICD-10-CM | POA: Diagnosis not present

## 2019-08-18 DIAGNOSIS — I10 Essential (primary) hypertension: Secondary | ICD-10-CM | POA: Diagnosis not present

## 2019-08-19 DIAGNOSIS — E7800 Pure hypercholesterolemia, unspecified: Secondary | ICD-10-CM | POA: Diagnosis not present

## 2019-08-19 DIAGNOSIS — I10 Essential (primary) hypertension: Secondary | ICD-10-CM | POA: Diagnosis not present

## 2019-08-19 DIAGNOSIS — E119 Type 2 diabetes mellitus without complications: Secondary | ICD-10-CM | POA: Diagnosis not present

## 2019-08-23 DIAGNOSIS — R070 Pain in throat: Secondary | ICD-10-CM | POA: Diagnosis not present

## 2019-08-23 DIAGNOSIS — Z7982 Long term (current) use of aspirin: Secondary | ICD-10-CM | POA: Diagnosis not present

## 2019-08-23 DIAGNOSIS — R05 Cough: Secondary | ICD-10-CM | POA: Diagnosis not present

## 2019-08-23 DIAGNOSIS — Z743 Need for continuous supervision: Secondary | ICD-10-CM | POA: Diagnosis not present

## 2019-08-23 DIAGNOSIS — E119 Type 2 diabetes mellitus without complications: Secondary | ICD-10-CM | POA: Diagnosis not present

## 2019-08-23 DIAGNOSIS — R5381 Other malaise: Secondary | ICD-10-CM | POA: Diagnosis not present

## 2019-08-23 DIAGNOSIS — Z79899 Other long term (current) drug therapy: Secondary | ICD-10-CM | POA: Diagnosis not present

## 2019-08-23 DIAGNOSIS — R404 Transient alteration of awareness: Secondary | ICD-10-CM | POA: Diagnosis not present

## 2019-08-23 DIAGNOSIS — I10 Essential (primary) hypertension: Secondary | ICD-10-CM | POA: Diagnosis not present

## 2019-08-23 DIAGNOSIS — J209 Acute bronchitis, unspecified: Secondary | ICD-10-CM | POA: Diagnosis not present

## 2019-08-23 DIAGNOSIS — K219 Gastro-esophageal reflux disease without esophagitis: Secondary | ICD-10-CM | POA: Diagnosis not present

## 2019-08-24 DIAGNOSIS — N183 Chronic kidney disease, stage 3 unspecified: Secondary | ICD-10-CM | POA: Diagnosis not present

## 2019-08-24 DIAGNOSIS — D692 Other nonthrombocytopenic purpura: Secondary | ICD-10-CM | POA: Diagnosis not present

## 2019-08-24 DIAGNOSIS — I10 Essential (primary) hypertension: Secondary | ICD-10-CM | POA: Diagnosis not present

## 2019-08-24 DIAGNOSIS — J209 Acute bronchitis, unspecified: Secondary | ICD-10-CM | POA: Diagnosis not present

## 2019-08-24 DIAGNOSIS — Z299 Encounter for prophylactic measures, unspecified: Secondary | ICD-10-CM | POA: Diagnosis not present

## 2019-08-25 DIAGNOSIS — R05 Cough: Secondary | ICD-10-CM | POA: Diagnosis not present

## 2019-08-25 DIAGNOSIS — Z8709 Personal history of other diseases of the respiratory system: Secondary | ICD-10-CM | POA: Diagnosis not present

## 2019-08-29 DIAGNOSIS — R404 Transient alteration of awareness: Secondary | ICD-10-CM | POA: Diagnosis not present

## 2019-08-29 DIAGNOSIS — E162 Hypoglycemia, unspecified: Secondary | ICD-10-CM | POA: Diagnosis not present

## 2019-08-29 DIAGNOSIS — R4020 Unspecified coma: Secondary | ICD-10-CM | POA: Diagnosis not present

## 2019-08-29 DIAGNOSIS — E161 Other hypoglycemia: Secondary | ICD-10-CM | POA: Diagnosis not present

## 2019-08-29 DIAGNOSIS — E1165 Type 2 diabetes mellitus with hyperglycemia: Secondary | ICD-10-CM | POA: Diagnosis not present

## 2019-09-01 DIAGNOSIS — N1831 Chronic kidney disease, stage 3a: Secondary | ICD-10-CM | POA: Diagnosis not present

## 2019-09-01 DIAGNOSIS — I10 Essential (primary) hypertension: Secondary | ICD-10-CM | POA: Diagnosis not present

## 2019-09-01 DIAGNOSIS — Z79899 Other long term (current) drug therapy: Secondary | ICD-10-CM | POA: Diagnosis not present

## 2019-09-01 DIAGNOSIS — R05 Cough: Secondary | ICD-10-CM | POA: Diagnosis not present

## 2019-09-01 DIAGNOSIS — K219 Gastro-esophageal reflux disease without esophagitis: Secondary | ICD-10-CM | POA: Diagnosis not present

## 2019-09-01 DIAGNOSIS — J40 Bronchitis, not specified as acute or chronic: Secondary | ICD-10-CM | POA: Diagnosis not present

## 2019-09-01 DIAGNOSIS — E119 Type 2 diabetes mellitus without complications: Secondary | ICD-10-CM | POA: Diagnosis not present

## 2019-09-01 DIAGNOSIS — N390 Urinary tract infection, site not specified: Secondary | ICD-10-CM | POA: Diagnosis not present

## 2019-09-01 DIAGNOSIS — N289 Disorder of kidney and ureter, unspecified: Secondary | ICD-10-CM | POA: Diagnosis not present

## 2019-09-01 DIAGNOSIS — Z299 Encounter for prophylactic measures, unspecified: Secondary | ICD-10-CM | POA: Diagnosis not present

## 2019-09-01 DIAGNOSIS — E860 Dehydration: Secondary | ICD-10-CM | POA: Diagnosis not present

## 2019-09-01 DIAGNOSIS — Z87891 Personal history of nicotine dependence: Secondary | ICD-10-CM | POA: Diagnosis not present

## 2019-09-01 DIAGNOSIS — Z7982 Long term (current) use of aspirin: Secondary | ICD-10-CM | POA: Diagnosis not present

## 2019-09-01 DIAGNOSIS — E1165 Type 2 diabetes mellitus with hyperglycemia: Secondary | ICD-10-CM | POA: Diagnosis not present

## 2019-09-01 DIAGNOSIS — J209 Acute bronchitis, unspecified: Secondary | ICD-10-CM | POA: Diagnosis not present

## 2019-09-04 DIAGNOSIS — E119 Type 2 diabetes mellitus without complications: Secondary | ICD-10-CM | POA: Diagnosis not present

## 2019-09-05 DIAGNOSIS — E1122 Type 2 diabetes mellitus with diabetic chronic kidney disease: Secondary | ICD-10-CM | POA: Diagnosis not present

## 2019-09-05 DIAGNOSIS — R531 Weakness: Secondary | ICD-10-CM | POA: Diagnosis not present

## 2019-09-05 DIAGNOSIS — E538 Deficiency of other specified B group vitamins: Secondary | ICD-10-CM | POA: Diagnosis not present

## 2019-09-05 DIAGNOSIS — Z20822 Contact with and (suspected) exposure to covid-19: Secondary | ICD-10-CM | POA: Diagnosis not present

## 2019-09-05 DIAGNOSIS — R1110 Vomiting, unspecified: Secondary | ICD-10-CM | POA: Diagnosis not present

## 2019-09-05 DIAGNOSIS — E1140 Type 2 diabetes mellitus with diabetic neuropathy, unspecified: Secondary | ICD-10-CM | POA: Diagnosis not present

## 2019-09-05 DIAGNOSIS — N200 Calculus of kidney: Secondary | ICD-10-CM | POA: Diagnosis not present

## 2019-09-05 DIAGNOSIS — N183 Chronic kidney disease, stage 3 unspecified: Secondary | ICD-10-CM | POA: Diagnosis not present

## 2019-09-05 DIAGNOSIS — Z87891 Personal history of nicotine dependence: Secondary | ICD-10-CM | POA: Diagnosis not present

## 2019-09-05 DIAGNOSIS — J209 Acute bronchitis, unspecified: Secondary | ICD-10-CM | POA: Diagnosis not present

## 2019-09-05 DIAGNOSIS — E860 Dehydration: Secondary | ICD-10-CM | POA: Diagnosis not present

## 2019-09-05 DIAGNOSIS — R410 Disorientation, unspecified: Secondary | ICD-10-CM | POA: Diagnosis not present

## 2019-09-05 DIAGNOSIS — E876 Hypokalemia: Secondary | ICD-10-CM | POA: Diagnosis not present

## 2019-09-05 DIAGNOSIS — D649 Anemia, unspecified: Secondary | ICD-10-CM | POA: Diagnosis not present

## 2019-09-05 DIAGNOSIS — I700 Atherosclerosis of aorta: Secondary | ICD-10-CM | POA: Diagnosis not present

## 2019-09-05 DIAGNOSIS — K449 Diaphragmatic hernia without obstruction or gangrene: Secondary | ICD-10-CM | POA: Diagnosis not present

## 2019-09-05 DIAGNOSIS — Z7984 Long term (current) use of oral hypoglycemic drugs: Secondary | ICD-10-CM | POA: Diagnosis not present

## 2019-09-05 DIAGNOSIS — K219 Gastro-esophageal reflux disease without esophagitis: Secondary | ICD-10-CM | POA: Diagnosis not present

## 2019-09-05 DIAGNOSIS — Z7982 Long term (current) use of aspirin: Secondary | ICD-10-CM | POA: Diagnosis not present

## 2019-09-05 DIAGNOSIS — I129 Hypertensive chronic kidney disease with stage 1 through stage 4 chronic kidney disease, or unspecified chronic kidney disease: Secondary | ICD-10-CM | POA: Diagnosis not present

## 2019-09-05 DIAGNOSIS — K8020 Calculus of gallbladder without cholecystitis without obstruction: Secondary | ICD-10-CM | POA: Diagnosis not present

## 2019-09-05 DIAGNOSIS — N179 Acute kidney failure, unspecified: Secondary | ICD-10-CM | POA: Diagnosis not present

## 2019-09-10 DIAGNOSIS — Z299 Encounter for prophylactic measures, unspecified: Secondary | ICD-10-CM | POA: Diagnosis not present

## 2019-09-10 DIAGNOSIS — R110 Nausea: Secondary | ICD-10-CM | POA: Diagnosis not present

## 2019-09-10 DIAGNOSIS — R413 Other amnesia: Secondary | ICD-10-CM | POA: Diagnosis not present

## 2019-09-10 DIAGNOSIS — I10 Essential (primary) hypertension: Secondary | ICD-10-CM | POA: Diagnosis not present

## 2019-09-10 DIAGNOSIS — E113292 Type 2 diabetes mellitus with mild nonproliferative diabetic retinopathy without macular edema, left eye: Secondary | ICD-10-CM | POA: Diagnosis not present

## 2019-09-10 DIAGNOSIS — E1165 Type 2 diabetes mellitus with hyperglycemia: Secondary | ICD-10-CM | POA: Diagnosis not present

## 2019-09-15 DIAGNOSIS — I10 Essential (primary) hypertension: Secondary | ICD-10-CM | POA: Diagnosis not present

## 2019-09-15 DIAGNOSIS — E538 Deficiency of other specified B group vitamins: Secondary | ICD-10-CM | POA: Diagnosis not present

## 2019-09-15 DIAGNOSIS — Z299 Encounter for prophylactic measures, unspecified: Secondary | ICD-10-CM | POA: Diagnosis not present

## 2019-09-15 DIAGNOSIS — E1165 Type 2 diabetes mellitus with hyperglycemia: Secondary | ICD-10-CM | POA: Diagnosis not present

## 2019-09-15 DIAGNOSIS — E113292 Type 2 diabetes mellitus with mild nonproliferative diabetic retinopathy without macular edema, left eye: Secondary | ICD-10-CM | POA: Diagnosis not present

## 2019-09-16 DIAGNOSIS — E113292 Type 2 diabetes mellitus with mild nonproliferative diabetic retinopathy without macular edema, left eye: Secondary | ICD-10-CM | POA: Diagnosis not present

## 2019-09-16 DIAGNOSIS — E538 Deficiency of other specified B group vitamins: Secondary | ICD-10-CM | POA: Diagnosis not present

## 2019-09-16 DIAGNOSIS — Z299 Encounter for prophylactic measures, unspecified: Secondary | ICD-10-CM | POA: Diagnosis not present

## 2019-09-16 DIAGNOSIS — H6120 Impacted cerumen, unspecified ear: Secondary | ICD-10-CM | POA: Diagnosis not present

## 2019-09-16 DIAGNOSIS — E1165 Type 2 diabetes mellitus with hyperglycemia: Secondary | ICD-10-CM | POA: Diagnosis not present

## 2019-09-16 DIAGNOSIS — I10 Essential (primary) hypertension: Secondary | ICD-10-CM | POA: Diagnosis not present

## 2019-09-17 DIAGNOSIS — I10 Essential (primary) hypertension: Secondary | ICD-10-CM | POA: Diagnosis not present

## 2019-09-17 DIAGNOSIS — E538 Deficiency of other specified B group vitamins: Secondary | ICD-10-CM | POA: Diagnosis not present

## 2019-09-17 DIAGNOSIS — R413 Other amnesia: Secondary | ICD-10-CM | POA: Diagnosis not present

## 2019-09-17 DIAGNOSIS — Z299 Encounter for prophylactic measures, unspecified: Secondary | ICD-10-CM | POA: Diagnosis not present

## 2019-09-23 DIAGNOSIS — Z299 Encounter for prophylactic measures, unspecified: Secondary | ICD-10-CM | POA: Diagnosis not present

## 2019-09-23 DIAGNOSIS — E113292 Type 2 diabetes mellitus with mild nonproliferative diabetic retinopathy without macular edema, left eye: Secondary | ICD-10-CM | POA: Diagnosis not present

## 2019-09-23 DIAGNOSIS — E538 Deficiency of other specified B group vitamins: Secondary | ICD-10-CM | POA: Diagnosis not present

## 2019-09-23 DIAGNOSIS — E1165 Type 2 diabetes mellitus with hyperglycemia: Secondary | ICD-10-CM | POA: Diagnosis not present

## 2019-09-23 DIAGNOSIS — I10 Essential (primary) hypertension: Secondary | ICD-10-CM | POA: Diagnosis not present

## 2019-09-29 DIAGNOSIS — Z1211 Encounter for screening for malignant neoplasm of colon: Secondary | ICD-10-CM | POA: Diagnosis not present

## 2019-09-29 DIAGNOSIS — Z7189 Other specified counseling: Secondary | ICD-10-CM | POA: Diagnosis not present

## 2019-09-29 DIAGNOSIS — E1165 Type 2 diabetes mellitus with hyperglycemia: Secondary | ICD-10-CM | POA: Diagnosis not present

## 2019-09-29 DIAGNOSIS — E538 Deficiency of other specified B group vitamins: Secondary | ICD-10-CM | POA: Diagnosis not present

## 2019-09-29 DIAGNOSIS — Z0000 Encounter for general adult medical examination without abnormal findings: Secondary | ICD-10-CM | POA: Diagnosis not present

## 2019-09-29 DIAGNOSIS — I10 Essential (primary) hypertension: Secondary | ICD-10-CM | POA: Diagnosis not present

## 2019-09-29 DIAGNOSIS — Z299 Encounter for prophylactic measures, unspecified: Secondary | ICD-10-CM | POA: Diagnosis not present

## 2019-10-01 DIAGNOSIS — E7800 Pure hypercholesterolemia, unspecified: Secondary | ICD-10-CM | POA: Diagnosis not present

## 2019-10-01 DIAGNOSIS — E113292 Type 2 diabetes mellitus with mild nonproliferative diabetic retinopathy without macular edema, left eye: Secondary | ICD-10-CM | POA: Diagnosis not present

## 2019-10-01 DIAGNOSIS — Z299 Encounter for prophylactic measures, unspecified: Secondary | ICD-10-CM | POA: Diagnosis not present

## 2019-10-01 DIAGNOSIS — Z79899 Other long term (current) drug therapy: Secondary | ICD-10-CM | POA: Diagnosis not present

## 2019-10-01 DIAGNOSIS — R413 Other amnesia: Secondary | ICD-10-CM | POA: Diagnosis not present

## 2019-10-01 DIAGNOSIS — R5383 Other fatigue: Secondary | ICD-10-CM | POA: Diagnosis not present

## 2019-10-01 DIAGNOSIS — I10 Essential (primary) hypertension: Secondary | ICD-10-CM | POA: Diagnosis not present

## 2019-10-01 DIAGNOSIS — E1165 Type 2 diabetes mellitus with hyperglycemia: Secondary | ICD-10-CM | POA: Diagnosis not present

## 2019-10-01 DIAGNOSIS — E538 Deficiency of other specified B group vitamins: Secondary | ICD-10-CM | POA: Diagnosis not present

## 2019-10-04 DIAGNOSIS — I10 Essential (primary) hypertension: Secondary | ICD-10-CM | POA: Diagnosis not present

## 2019-10-04 DIAGNOSIS — E7800 Pure hypercholesterolemia, unspecified: Secondary | ICD-10-CM | POA: Diagnosis not present

## 2019-10-04 DIAGNOSIS — E119 Type 2 diabetes mellitus without complications: Secondary | ICD-10-CM | POA: Diagnosis not present

## 2019-10-07 DIAGNOSIS — E1165 Type 2 diabetes mellitus with hyperglycemia: Secondary | ICD-10-CM | POA: Diagnosis not present

## 2019-10-07 DIAGNOSIS — Z299 Encounter for prophylactic measures, unspecified: Secondary | ICD-10-CM | POA: Diagnosis not present

## 2019-10-07 DIAGNOSIS — N183 Chronic kidney disease, stage 3 unspecified: Secondary | ICD-10-CM | POA: Diagnosis not present

## 2019-10-07 DIAGNOSIS — E538 Deficiency of other specified B group vitamins: Secondary | ICD-10-CM | POA: Diagnosis not present

## 2019-10-16 DIAGNOSIS — D649 Anemia, unspecified: Secondary | ICD-10-CM | POA: Diagnosis not present

## 2019-10-16 DIAGNOSIS — E119 Type 2 diabetes mellitus without complications: Secondary | ICD-10-CM | POA: Diagnosis not present

## 2019-10-16 DIAGNOSIS — E1142 Type 2 diabetes mellitus with diabetic polyneuropathy: Secondary | ICD-10-CM | POA: Diagnosis not present

## 2019-10-16 DIAGNOSIS — E1165 Type 2 diabetes mellitus with hyperglycemia: Secondary | ICD-10-CM | POA: Diagnosis not present

## 2019-10-16 DIAGNOSIS — I10 Essential (primary) hypertension: Secondary | ICD-10-CM | POA: Diagnosis not present

## 2019-10-16 DIAGNOSIS — E1122 Type 2 diabetes mellitus with diabetic chronic kidney disease: Secondary | ICD-10-CM | POA: Diagnosis not present

## 2019-10-16 DIAGNOSIS — E538 Deficiency of other specified B group vitamins: Secondary | ICD-10-CM | POA: Diagnosis not present

## 2019-10-16 DIAGNOSIS — Z299 Encounter for prophylactic measures, unspecified: Secondary | ICD-10-CM | POA: Diagnosis not present

## 2019-11-04 DIAGNOSIS — E119 Type 2 diabetes mellitus without complications: Secondary | ICD-10-CM | POA: Diagnosis not present

## 2019-11-04 DIAGNOSIS — I10 Essential (primary) hypertension: Secondary | ICD-10-CM | POA: Diagnosis not present

## 2019-11-04 DIAGNOSIS — E7800 Pure hypercholesterolemia, unspecified: Secondary | ICD-10-CM | POA: Diagnosis not present

## 2019-11-25 DIAGNOSIS — E538 Deficiency of other specified B group vitamins: Secondary | ICD-10-CM | POA: Diagnosis not present

## 2019-11-25 DIAGNOSIS — E1122 Type 2 diabetes mellitus with diabetic chronic kidney disease: Secondary | ICD-10-CM | POA: Diagnosis not present

## 2019-11-25 DIAGNOSIS — E1142 Type 2 diabetes mellitus with diabetic polyneuropathy: Secondary | ICD-10-CM | POA: Diagnosis not present

## 2019-11-25 DIAGNOSIS — I10 Essential (primary) hypertension: Secondary | ICD-10-CM | POA: Diagnosis not present

## 2019-11-25 DIAGNOSIS — Z299 Encounter for prophylactic measures, unspecified: Secondary | ICD-10-CM | POA: Diagnosis not present

## 2019-11-25 DIAGNOSIS — E1165 Type 2 diabetes mellitus with hyperglycemia: Secondary | ICD-10-CM | POA: Diagnosis not present

## 2019-11-25 DIAGNOSIS — E113292 Type 2 diabetes mellitus with mild nonproliferative diabetic retinopathy without macular edema, left eye: Secondary | ICD-10-CM | POA: Diagnosis not present

## 2019-12-04 DIAGNOSIS — E119 Type 2 diabetes mellitus without complications: Secondary | ICD-10-CM | POA: Diagnosis not present

## 2019-12-04 DIAGNOSIS — I10 Essential (primary) hypertension: Secondary | ICD-10-CM | POA: Diagnosis not present

## 2019-12-04 DIAGNOSIS — E7800 Pure hypercholesterolemia, unspecified: Secondary | ICD-10-CM | POA: Diagnosis not present

## 2019-12-22 DIAGNOSIS — R21 Rash and other nonspecific skin eruption: Secondary | ICD-10-CM | POA: Diagnosis not present

## 2019-12-22 DIAGNOSIS — E113292 Type 2 diabetes mellitus with mild nonproliferative diabetic retinopathy without macular edema, left eye: Secondary | ICD-10-CM | POA: Diagnosis not present

## 2019-12-22 DIAGNOSIS — Z299 Encounter for prophylactic measures, unspecified: Secondary | ICD-10-CM | POA: Diagnosis not present

## 2019-12-22 DIAGNOSIS — E538 Deficiency of other specified B group vitamins: Secondary | ICD-10-CM | POA: Diagnosis not present

## 2019-12-22 DIAGNOSIS — I10 Essential (primary) hypertension: Secondary | ICD-10-CM | POA: Diagnosis not present

## 2019-12-30 ENCOUNTER — Encounter (INDEPENDENT_AMBULATORY_CARE_PROVIDER_SITE_OTHER): Payer: Self-pay | Admitting: Gastroenterology

## 2020-01-05 DIAGNOSIS — Z299 Encounter for prophylactic measures, unspecified: Secondary | ICD-10-CM | POA: Diagnosis not present

## 2020-01-05 DIAGNOSIS — E1165 Type 2 diabetes mellitus with hyperglycemia: Secondary | ICD-10-CM | POA: Diagnosis not present

## 2020-01-05 DIAGNOSIS — E119 Type 2 diabetes mellitus without complications: Secondary | ICD-10-CM | POA: Diagnosis not present

## 2020-01-05 DIAGNOSIS — E113292 Type 2 diabetes mellitus with mild nonproliferative diabetic retinopathy without macular edema, left eye: Secondary | ICD-10-CM | POA: Diagnosis not present

## 2020-01-05 DIAGNOSIS — D649 Anemia, unspecified: Secondary | ICD-10-CM | POA: Diagnosis not present

## 2020-01-05 DIAGNOSIS — I10 Essential (primary) hypertension: Secondary | ICD-10-CM | POA: Diagnosis not present

## 2020-01-18 ENCOUNTER — Ambulatory Visit (INDEPENDENT_AMBULATORY_CARE_PROVIDER_SITE_OTHER): Payer: Medicare Other | Admitting: Gastroenterology

## 2020-01-22 DIAGNOSIS — E538 Deficiency of other specified B group vitamins: Secondary | ICD-10-CM | POA: Diagnosis not present

## 2020-01-22 DIAGNOSIS — I10 Essential (primary) hypertension: Secondary | ICD-10-CM | POA: Diagnosis not present

## 2020-01-22 DIAGNOSIS — E113292 Type 2 diabetes mellitus with mild nonproliferative diabetic retinopathy without macular edema, left eye: Secondary | ICD-10-CM | POA: Diagnosis not present

## 2020-01-22 DIAGNOSIS — E1165 Type 2 diabetes mellitus with hyperglycemia: Secondary | ICD-10-CM | POA: Diagnosis not present

## 2020-01-22 DIAGNOSIS — Z299 Encounter for prophylactic measures, unspecified: Secondary | ICD-10-CM | POA: Diagnosis not present

## 2020-02-04 DIAGNOSIS — I10 Essential (primary) hypertension: Secondary | ICD-10-CM | POA: Diagnosis not present

## 2020-02-04 DIAGNOSIS — E7800 Pure hypercholesterolemia, unspecified: Secondary | ICD-10-CM | POA: Diagnosis not present

## 2020-02-04 DIAGNOSIS — E119 Type 2 diabetes mellitus without complications: Secondary | ICD-10-CM | POA: Diagnosis not present

## 2020-02-22 DIAGNOSIS — Z299 Encounter for prophylactic measures, unspecified: Secondary | ICD-10-CM | POA: Diagnosis not present

## 2020-02-22 DIAGNOSIS — E538 Deficiency of other specified B group vitamins: Secondary | ICD-10-CM | POA: Diagnosis not present

## 2020-03-04 DIAGNOSIS — I10 Essential (primary) hypertension: Secondary | ICD-10-CM | POA: Diagnosis not present

## 2020-03-04 DIAGNOSIS — E7800 Pure hypercholesterolemia, unspecified: Secondary | ICD-10-CM | POA: Diagnosis not present

## 2020-03-04 DIAGNOSIS — E119 Type 2 diabetes mellitus without complications: Secondary | ICD-10-CM | POA: Diagnosis not present

## 2020-03-05 DIAGNOSIS — E119 Type 2 diabetes mellitus without complications: Secondary | ICD-10-CM | POA: Diagnosis not present

## 2020-03-07 DIAGNOSIS — E1142 Type 2 diabetes mellitus with diabetic polyneuropathy: Secondary | ICD-10-CM | POA: Diagnosis not present

## 2020-03-07 DIAGNOSIS — E1122 Type 2 diabetes mellitus with diabetic chronic kidney disease: Secondary | ICD-10-CM | POA: Diagnosis not present

## 2020-03-07 DIAGNOSIS — I10 Essential (primary) hypertension: Secondary | ICD-10-CM | POA: Diagnosis not present

## 2020-03-07 DIAGNOSIS — E1165 Type 2 diabetes mellitus with hyperglycemia: Secondary | ICD-10-CM | POA: Diagnosis not present

## 2020-03-07 DIAGNOSIS — Z299 Encounter for prophylactic measures, unspecified: Secondary | ICD-10-CM | POA: Diagnosis not present

## 2020-03-07 DIAGNOSIS — E113292 Type 2 diabetes mellitus with mild nonproliferative diabetic retinopathy without macular edema, left eye: Secondary | ICD-10-CM | POA: Diagnosis not present

## 2020-03-25 DIAGNOSIS — E1165 Type 2 diabetes mellitus with hyperglycemia: Secondary | ICD-10-CM | POA: Diagnosis not present

## 2020-03-25 DIAGNOSIS — Z299 Encounter for prophylactic measures, unspecified: Secondary | ICD-10-CM | POA: Diagnosis not present

## 2020-03-25 DIAGNOSIS — I10 Essential (primary) hypertension: Secondary | ICD-10-CM | POA: Diagnosis not present

## 2020-03-25 DIAGNOSIS — E1122 Type 2 diabetes mellitus with diabetic chronic kidney disease: Secondary | ICD-10-CM | POA: Diagnosis not present

## 2020-03-25 DIAGNOSIS — E538 Deficiency of other specified B group vitamins: Secondary | ICD-10-CM | POA: Diagnosis not present

## 2020-03-25 DIAGNOSIS — E113292 Type 2 diabetes mellitus with mild nonproliferative diabetic retinopathy without macular edema, left eye: Secondary | ICD-10-CM | POA: Diagnosis not present

## 2020-04-05 DIAGNOSIS — E7800 Pure hypercholesterolemia, unspecified: Secondary | ICD-10-CM | POA: Diagnosis not present

## 2020-04-05 DIAGNOSIS — I10 Essential (primary) hypertension: Secondary | ICD-10-CM | POA: Diagnosis not present

## 2020-04-05 DIAGNOSIS — E119 Type 2 diabetes mellitus without complications: Secondary | ICD-10-CM | POA: Diagnosis not present

## 2020-04-25 DIAGNOSIS — R5383 Other fatigue: Secondary | ICD-10-CM | POA: Diagnosis not present

## 2020-05-05 DIAGNOSIS — E119 Type 2 diabetes mellitus without complications: Secondary | ICD-10-CM | POA: Diagnosis not present

## 2020-05-05 DIAGNOSIS — I10 Essential (primary) hypertension: Secondary | ICD-10-CM | POA: Diagnosis not present

## 2020-05-05 DIAGNOSIS — E7800 Pure hypercholesterolemia, unspecified: Secondary | ICD-10-CM | POA: Diagnosis not present

## 2020-05-26 DIAGNOSIS — E538 Deficiency of other specified B group vitamins: Secondary | ICD-10-CM | POA: Diagnosis not present

## 2020-06-06 DIAGNOSIS — E119 Type 2 diabetes mellitus without complications: Secondary | ICD-10-CM | POA: Diagnosis not present

## 2020-06-13 DIAGNOSIS — N183 Chronic kidney disease, stage 3 unspecified: Secondary | ICD-10-CM | POA: Diagnosis not present

## 2020-06-13 DIAGNOSIS — E1142 Type 2 diabetes mellitus with diabetic polyneuropathy: Secondary | ICD-10-CM | POA: Diagnosis not present

## 2020-06-13 DIAGNOSIS — Z299 Encounter for prophylactic measures, unspecified: Secondary | ICD-10-CM | POA: Diagnosis not present

## 2020-06-13 DIAGNOSIS — I10 Essential (primary) hypertension: Secondary | ICD-10-CM | POA: Diagnosis not present

## 2020-06-13 DIAGNOSIS — E1165 Type 2 diabetes mellitus with hyperglycemia: Secondary | ICD-10-CM | POA: Diagnosis not present

## 2020-06-13 DIAGNOSIS — E1122 Type 2 diabetes mellitus with diabetic chronic kidney disease: Secondary | ICD-10-CM | POA: Diagnosis not present

## 2020-06-27 DIAGNOSIS — E538 Deficiency of other specified B group vitamins: Secondary | ICD-10-CM | POA: Diagnosis not present

## 2020-07-04 DIAGNOSIS — E119 Type 2 diabetes mellitus without complications: Secondary | ICD-10-CM | POA: Diagnosis not present

## 2020-07-25 DIAGNOSIS — E538 Deficiency of other specified B group vitamins: Secondary | ICD-10-CM | POA: Diagnosis not present

## 2020-07-29 DIAGNOSIS — M79662 Pain in left lower leg: Secondary | ICD-10-CM | POA: Diagnosis not present

## 2020-08-01 DIAGNOSIS — M7989 Other specified soft tissue disorders: Secondary | ICD-10-CM | POA: Diagnosis not present

## 2020-08-01 DIAGNOSIS — M79662 Pain in left lower leg: Secondary | ICD-10-CM | POA: Diagnosis not present

## 2020-08-04 DIAGNOSIS — E119 Type 2 diabetes mellitus without complications: Secondary | ICD-10-CM | POA: Diagnosis not present

## 2020-08-08 DIAGNOSIS — D0471 Carcinoma in situ of skin of right lower limb, including hip: Secondary | ICD-10-CM | POA: Diagnosis not present

## 2020-08-08 DIAGNOSIS — L308 Other specified dermatitis: Secondary | ICD-10-CM | POA: Diagnosis not present

## 2020-08-25 DIAGNOSIS — E538 Deficiency of other specified B group vitamins: Secondary | ICD-10-CM | POA: Diagnosis not present

## 2020-09-02 DIAGNOSIS — E119 Type 2 diabetes mellitus without complications: Secondary | ICD-10-CM | POA: Diagnosis not present

## 2020-09-12 DIAGNOSIS — E1165 Type 2 diabetes mellitus with hyperglycemia: Secondary | ICD-10-CM | POA: Diagnosis not present

## 2020-09-12 DIAGNOSIS — R809 Proteinuria, unspecified: Secondary | ICD-10-CM | POA: Diagnosis not present

## 2020-09-12 DIAGNOSIS — I10 Essential (primary) hypertension: Secondary | ICD-10-CM | POA: Diagnosis not present

## 2020-09-12 DIAGNOSIS — E113292 Type 2 diabetes mellitus with mild nonproliferative diabetic retinopathy without macular edema, left eye: Secondary | ICD-10-CM | POA: Diagnosis not present

## 2020-09-12 DIAGNOSIS — Z299 Encounter for prophylactic measures, unspecified: Secondary | ICD-10-CM | POA: Diagnosis not present

## 2020-09-12 DIAGNOSIS — E1129 Type 2 diabetes mellitus with other diabetic kidney complication: Secondary | ICD-10-CM | POA: Diagnosis not present

## 2020-09-19 DIAGNOSIS — Z08 Encounter for follow-up examination after completed treatment for malignant neoplasm: Secondary | ICD-10-CM | POA: Diagnosis not present

## 2020-09-19 DIAGNOSIS — Z85828 Personal history of other malignant neoplasm of skin: Secondary | ICD-10-CM | POA: Diagnosis not present

## 2020-09-30 DIAGNOSIS — Z87891 Personal history of nicotine dependence: Secondary | ICD-10-CM | POA: Diagnosis not present

## 2020-09-30 DIAGNOSIS — Z7189 Other specified counseling: Secondary | ICD-10-CM | POA: Diagnosis not present

## 2020-09-30 DIAGNOSIS — Z299 Encounter for prophylactic measures, unspecified: Secondary | ICD-10-CM | POA: Diagnosis not present

## 2020-09-30 DIAGNOSIS — Z0000 Encounter for general adult medical examination without abnormal findings: Secondary | ICD-10-CM | POA: Diagnosis not present

## 2020-09-30 DIAGNOSIS — E538 Deficiency of other specified B group vitamins: Secondary | ICD-10-CM | POA: Diagnosis not present

## 2020-09-30 DIAGNOSIS — E1165 Type 2 diabetes mellitus with hyperglycemia: Secondary | ICD-10-CM | POA: Diagnosis not present

## 2020-09-30 DIAGNOSIS — N1831 Chronic kidney disease, stage 3a: Secondary | ICD-10-CM | POA: Diagnosis not present

## 2020-09-30 DIAGNOSIS — I10 Essential (primary) hypertension: Secondary | ICD-10-CM | POA: Diagnosis not present

## 2020-10-04 DIAGNOSIS — E119 Type 2 diabetes mellitus without complications: Secondary | ICD-10-CM | POA: Diagnosis not present

## 2020-10-05 DIAGNOSIS — E538 Deficiency of other specified B group vitamins: Secondary | ICD-10-CM | POA: Diagnosis not present

## 2020-10-05 DIAGNOSIS — E7800 Pure hypercholesterolemia, unspecified: Secondary | ICD-10-CM | POA: Diagnosis not present

## 2020-10-05 DIAGNOSIS — R5383 Other fatigue: Secondary | ICD-10-CM | POA: Diagnosis not present

## 2020-10-05 DIAGNOSIS — Z79899 Other long term (current) drug therapy: Secondary | ICD-10-CM | POA: Diagnosis not present

## 2020-10-05 DIAGNOSIS — E559 Vitamin D deficiency, unspecified: Secondary | ICD-10-CM | POA: Diagnosis not present

## 2020-10-31 DIAGNOSIS — E538 Deficiency of other specified B group vitamins: Secondary | ICD-10-CM | POA: Diagnosis not present

## 2020-10-31 DIAGNOSIS — Z299 Encounter for prophylactic measures, unspecified: Secondary | ICD-10-CM | POA: Diagnosis not present

## 2020-10-31 DIAGNOSIS — R42 Dizziness and giddiness: Secondary | ICD-10-CM | POA: Diagnosis not present

## 2020-10-31 DIAGNOSIS — E1165 Type 2 diabetes mellitus with hyperglycemia: Secondary | ICD-10-CM | POA: Diagnosis not present

## 2020-10-31 DIAGNOSIS — I10 Essential (primary) hypertension: Secondary | ICD-10-CM | POA: Diagnosis not present

## 2020-11-03 DIAGNOSIS — E119 Type 2 diabetes mellitus without complications: Secondary | ICD-10-CM | POA: Diagnosis not present

## 2020-11-30 DIAGNOSIS — E538 Deficiency of other specified B group vitamins: Secondary | ICD-10-CM | POA: Diagnosis not present

## 2020-12-02 DIAGNOSIS — E119 Type 2 diabetes mellitus without complications: Secondary | ICD-10-CM | POA: Diagnosis not present

## 2020-12-04 DIAGNOSIS — E119 Type 2 diabetes mellitus without complications: Secondary | ICD-10-CM | POA: Diagnosis not present

## 2020-12-12 DIAGNOSIS — D72829 Elevated white blood cell count, unspecified: Secondary | ICD-10-CM | POA: Diagnosis not present

## 2020-12-12 DIAGNOSIS — S51812A Laceration without foreign body of left forearm, initial encounter: Secondary | ICD-10-CM | POA: Diagnosis not present

## 2020-12-26 DIAGNOSIS — I10 Essential (primary) hypertension: Secondary | ICD-10-CM | POA: Diagnosis not present

## 2020-12-26 DIAGNOSIS — E1165 Type 2 diabetes mellitus with hyperglycemia: Secondary | ICD-10-CM | POA: Diagnosis not present

## 2020-12-26 DIAGNOSIS — Z299 Encounter for prophylactic measures, unspecified: Secondary | ICD-10-CM | POA: Diagnosis not present

## 2020-12-26 DIAGNOSIS — E538 Deficiency of other specified B group vitamins: Secondary | ICD-10-CM | POA: Diagnosis not present

## 2020-12-26 DIAGNOSIS — E113292 Type 2 diabetes mellitus with mild nonproliferative diabetic retinopathy without macular edema, left eye: Secondary | ICD-10-CM | POA: Diagnosis not present

## 2020-12-27 DIAGNOSIS — E119 Type 2 diabetes mellitus without complications: Secondary | ICD-10-CM | POA: Diagnosis not present

## 2021-01-04 DIAGNOSIS — E119 Type 2 diabetes mellitus without complications: Secondary | ICD-10-CM | POA: Diagnosis not present

## 2021-01-24 DIAGNOSIS — E538 Deficiency of other specified B group vitamins: Secondary | ICD-10-CM | POA: Diagnosis not present

## 2021-02-03 DIAGNOSIS — E119 Type 2 diabetes mellitus without complications: Secondary | ICD-10-CM | POA: Diagnosis not present

## 2021-02-23 DIAGNOSIS — Z23 Encounter for immunization: Secondary | ICD-10-CM | POA: Diagnosis not present

## 2021-02-23 DIAGNOSIS — E538 Deficiency of other specified B group vitamins: Secondary | ICD-10-CM | POA: Diagnosis not present

## 2021-03-06 DIAGNOSIS — E119 Type 2 diabetes mellitus without complications: Secondary | ICD-10-CM | POA: Diagnosis not present

## 2021-03-28 DIAGNOSIS — I10 Essential (primary) hypertension: Secondary | ICD-10-CM | POA: Diagnosis not present

## 2021-03-28 DIAGNOSIS — E538 Deficiency of other specified B group vitamins: Secondary | ICD-10-CM | POA: Diagnosis not present

## 2021-04-05 DIAGNOSIS — E113292 Type 2 diabetes mellitus with mild nonproliferative diabetic retinopathy without macular edema, left eye: Secondary | ICD-10-CM | POA: Diagnosis not present

## 2021-04-05 DIAGNOSIS — Z299 Encounter for prophylactic measures, unspecified: Secondary | ICD-10-CM | POA: Diagnosis not present

## 2021-04-05 DIAGNOSIS — E119 Type 2 diabetes mellitus without complications: Secondary | ICD-10-CM | POA: Diagnosis not present

## 2021-04-05 DIAGNOSIS — I10 Essential (primary) hypertension: Secondary | ICD-10-CM | POA: Diagnosis not present

## 2021-04-05 DIAGNOSIS — E1165 Type 2 diabetes mellitus with hyperglycemia: Secondary | ICD-10-CM | POA: Diagnosis not present

## 2021-04-27 DIAGNOSIS — E538 Deficiency of other specified B group vitamins: Secondary | ICD-10-CM | POA: Diagnosis not present

## 2021-05-05 DIAGNOSIS — E119 Type 2 diabetes mellitus without complications: Secondary | ICD-10-CM | POA: Diagnosis not present

## 2021-05-22 DIAGNOSIS — C44712 Basal cell carcinoma of skin of right lower limb, including hip: Secondary | ICD-10-CM | POA: Diagnosis not present

## 2021-05-22 DIAGNOSIS — Z08 Encounter for follow-up examination after completed treatment for malignant neoplasm: Secondary | ICD-10-CM | POA: Diagnosis not present

## 2021-05-22 DIAGNOSIS — L820 Inflamed seborrheic keratosis: Secondary | ICD-10-CM | POA: Diagnosis not present

## 2021-05-22 DIAGNOSIS — Z85828 Personal history of other malignant neoplasm of skin: Secondary | ICD-10-CM | POA: Diagnosis not present

## 2021-05-22 DIAGNOSIS — D0472 Carcinoma in situ of skin of left lower limb, including hip: Secondary | ICD-10-CM | POA: Diagnosis not present

## 2021-05-30 DIAGNOSIS — E538 Deficiency of other specified B group vitamins: Secondary | ICD-10-CM | POA: Diagnosis not present

## 2021-06-04 DIAGNOSIS — E119 Type 2 diabetes mellitus without complications: Secondary | ICD-10-CM | POA: Diagnosis not present

## 2021-06-22 DIAGNOSIS — Z08 Encounter for follow-up examination after completed treatment for malignant neoplasm: Secondary | ICD-10-CM | POA: Diagnosis not present

## 2021-06-22 DIAGNOSIS — C44612 Basal cell carcinoma of skin of right upper limb, including shoulder: Secondary | ICD-10-CM | POA: Diagnosis not present

## 2021-06-22 DIAGNOSIS — Z85828 Personal history of other malignant neoplasm of skin: Secondary | ICD-10-CM | POA: Diagnosis not present

## 2021-06-22 DIAGNOSIS — C44519 Basal cell carcinoma of skin of other part of trunk: Secondary | ICD-10-CM | POA: Diagnosis not present

## 2021-06-28 DIAGNOSIS — I10 Essential (primary) hypertension: Secondary | ICD-10-CM | POA: Diagnosis not present

## 2021-06-28 DIAGNOSIS — R109 Unspecified abdominal pain: Secondary | ICD-10-CM | POA: Diagnosis not present

## 2021-06-28 DIAGNOSIS — K828 Other specified diseases of gallbladder: Secondary | ICD-10-CM | POA: Diagnosis not present

## 2021-06-28 DIAGNOSIS — N200 Calculus of kidney: Secondary | ICD-10-CM | POA: Diagnosis not present

## 2021-06-28 DIAGNOSIS — R7989 Other specified abnormal findings of blood chemistry: Secondary | ICD-10-CM | POA: Diagnosis not present

## 2021-06-28 DIAGNOSIS — K8050 Calculus of bile duct without cholangitis or cholecystitis without obstruction: Secondary | ICD-10-CM | POA: Diagnosis not present

## 2021-06-28 DIAGNOSIS — I700 Atherosclerosis of aorta: Secondary | ICD-10-CM | POA: Diagnosis not present

## 2021-06-28 DIAGNOSIS — E119 Type 2 diabetes mellitus without complications: Secondary | ICD-10-CM | POA: Diagnosis not present

## 2021-06-28 DIAGNOSIS — K8020 Calculus of gallbladder without cholecystitis without obstruction: Secondary | ICD-10-CM | POA: Diagnosis not present

## 2021-06-28 DIAGNOSIS — R112 Nausea with vomiting, unspecified: Secondary | ICD-10-CM | POA: Diagnosis not present

## 2021-06-28 DIAGNOSIS — Z7982 Long term (current) use of aspirin: Secondary | ICD-10-CM | POA: Diagnosis not present

## 2021-06-28 DIAGNOSIS — K7689 Other specified diseases of liver: Secondary | ICD-10-CM | POA: Diagnosis not present

## 2021-06-28 DIAGNOSIS — Z7984 Long term (current) use of oral hypoglycemic drugs: Secondary | ICD-10-CM | POA: Diagnosis not present

## 2021-06-30 DIAGNOSIS — K8040 Calculus of bile duct with cholecystitis, unspecified, without obstruction: Secondary | ICD-10-CM | POA: Diagnosis not present

## 2021-06-30 DIAGNOSIS — Z299 Encounter for prophylactic measures, unspecified: Secondary | ICD-10-CM | POA: Diagnosis not present

## 2021-06-30 DIAGNOSIS — E113292 Type 2 diabetes mellitus with mild nonproliferative diabetic retinopathy without macular edema, left eye: Secondary | ICD-10-CM | POA: Diagnosis not present

## 2021-06-30 DIAGNOSIS — E538 Deficiency of other specified B group vitamins: Secondary | ICD-10-CM | POA: Diagnosis not present

## 2021-06-30 DIAGNOSIS — E1165 Type 2 diabetes mellitus with hyperglycemia: Secondary | ICD-10-CM | POA: Diagnosis not present

## 2021-06-30 DIAGNOSIS — I10 Essential (primary) hypertension: Secondary | ICD-10-CM | POA: Diagnosis not present

## 2021-07-03 DIAGNOSIS — K8020 Calculus of gallbladder without cholecystitis without obstruction: Secondary | ICD-10-CM | POA: Diagnosis not present

## 2021-07-03 DIAGNOSIS — K8050 Calculus of bile duct without cholangitis or cholecystitis without obstruction: Secondary | ICD-10-CM | POA: Diagnosis not present

## 2021-07-03 DIAGNOSIS — N281 Cyst of kidney, acquired: Secondary | ICD-10-CM | POA: Diagnosis not present

## 2021-07-04 ENCOUNTER — Ambulatory Visit (INDEPENDENT_AMBULATORY_CARE_PROVIDER_SITE_OTHER): Payer: Medicare Other | Admitting: Internal Medicine

## 2021-07-04 ENCOUNTER — Encounter (INDEPENDENT_AMBULATORY_CARE_PROVIDER_SITE_OTHER): Payer: Self-pay | Admitting: Internal Medicine

## 2021-07-04 ENCOUNTER — Encounter (INDEPENDENT_AMBULATORY_CARE_PROVIDER_SITE_OTHER): Payer: Self-pay

## 2021-07-04 ENCOUNTER — Other Ambulatory Visit: Payer: Self-pay

## 2021-07-04 ENCOUNTER — Other Ambulatory Visit (INDEPENDENT_AMBULATORY_CARE_PROVIDER_SITE_OTHER): Payer: Self-pay

## 2021-07-04 DIAGNOSIS — K8050 Calculus of bile duct without cholangitis or cholecystitis without obstruction: Secondary | ICD-10-CM | POA: Insufficient documentation

## 2021-07-04 DIAGNOSIS — E119 Type 2 diabetes mellitus without complications: Secondary | ICD-10-CM | POA: Diagnosis not present

## 2021-07-04 DIAGNOSIS — M179 Osteoarthritis of knee, unspecified: Secondary | ICD-10-CM | POA: Insufficient documentation

## 2021-07-04 NOTE — Patient Instructions (Signed)
ERCP/endoscopic retrograde cholangiopancreatography stone removal to be scheduled.

## 2021-07-04 NOTE — H&P (View-Only) (Signed)
Reason for consultation  Choledocholithiasis  History of present illness  Patient is 88 year old Caucasian female who is referred through courtesy of Dr. Woody Seller for urgent consultation for therapeutic ERCP. Patient is accompanied by her husband Mortimer Fries. Patient says she was in usual state of health until about a month ago when she developed epigastric pain along with heaving pain lasted for more than 6 hours.  She did fine until 6 days ago when she noted pain in epigastric region which was quite intense.  She did not experience nausea vomiting fever or chills.  She was evaluated in the emergency room at [facility] Lab studies revealed WBC of 19.4 with H&H of 11 and 32.9 and platelet count of 217K.  Her electrolytes were normal.  BUN was 49 creatinine 2.13.  Glucose was 351 her bilirubin was 3.4, AP 130, AST 339 ALT 385 and total protein 6.1 with albumin of 2.6.  Urine analysis revealed 15 WBCs per high-power field 20 RBCs and positive leukocyte Estrace.  She underwent abdominal pelvic CT which revealed cholelithiasis and suggested choledocholithiasis but image quality was not optimal.  2 small nonobstructive stones were noted in the left kidney morphologic changes to the liver suggestive of cirrhosis.  Surgery was consulted and recommended ERCP to clear bile duct stone prior to cholecystectomy.  Patient was discharged on antibiotic. She was seen at [facility] internal medicine on 06/30/2021 and underwent abdominal ultrasound yesterday which revealed cholelithiasis dilated bile duct measuring 11 mm along with 5 mm stone and liver morphology appeared to be normal. Patient is presently free of abdominal pain.  She states she has good appetite.  Her husband states she eats Cheerios all the time.  She has multiple snacks but not regular meals.  She denies weight loss.  Current Medications: Outpatient Encounter Medications as of 07/04/2021  Medication Sig   aspirin EC 81 MG tablet Take 81 mg by mouth daily.      Aspirin-Acetaminophen-Caffeine (EXCEDRIN MIGRAINE PO) Take by mouth as needed.   citalopram (CELEXA) 20 MG tablet Take 20 mg by mouth daily.   Cyanocobalamin 1000 MCG/ML KIT Inject as directed every 30 (thirty) days. Once a month   diltiazem (CARDIZEM CD) 240 MG 24 hr capsule Take 1 capsule (240 mg total) by mouth daily.   ferrous sulfate 325 (65 FE) MG tablet Take 325 mg by mouth 2 (two) times daily with a meal.   gabapentin (NEURONTIN) 300 MG capsule Take 300 mg by mouth daily.    glimepiride (AMARYL) 4 MG tablet Take 4 mg by mouth 2 (two) times daily.     glipiZIDE (GLUCOTROL) 5 MG tablet Take by mouth daily.   metFORMIN (GLUCOPHAGE) 500 MG tablet Take 500 mg by mouth. One daily   metoprolol succinate (TOPROL-XL) 100 MG 24 hr tablet Take 100 mg by mouth daily.   omeprazole (PRILOSEC) 20 MG capsule Take 20 mg by mouth 2 (two) times daily before a meal.   valsartan-hydrochlorothiazide (DIOVAN-HCT) 160-12.5 MG per tablet Take 1 tablet by mouth daily.     [DISCONTINUED] DICLOFENAC PO Take by mouth. 50 mg one bid with food prn leg pain.   [DISCONTINUED] meclizine (ANTIVERT) 12.5 MG tablet Take 12.5 mg by mouth 3 (three) times daily as needed for dizziness.   [DISCONTINUED] memantine (NAMENDA XR) 14 MG CP24 24 hr capsule Take 14 mg by mouth daily.   [DISCONTINUED] triamcinolone cream (KENALOG) 0.1 % Apply 1 application topically 2 (two) times daily. prn   furosemide (LASIX) 20 MG tablet Take 20 mg  by mouth. Prn leg swelling   ONGLYZA 5 MG TABS tablet Take 5 mg by mouth daily.  (Patient not taking: Reported on 07/04/2021)   potassium chloride (KLOR-CON) 10 MEQ tablet Take 10 mEq by mouth. One daily prn with lasix   rosuvastatin (CRESTOR) 10 MG tablet Take 10 mg by mouth daily.   No facility-administered encounter medications on file as of 07/04/2021.   Past medical history  Hypertension Hyperlipidemia History of premature ventricular contractions Diabetes mellitus of 20 years  duration Nonproliferative diabetic retinopathy left eye Chronic GERD Depression Obesity Chronic kidney disease. B12 deficiency History of kidney stones She had screening colonoscopy about 5 years ago and reportedly was normal. Osteoarthrosis of knees  Allergies  No Known Allergies  Family history  Mother lived to be 22 years old.  Father died at age 43.  She had 5 brothers and 4 sisters and they all lived to be in their 27s 80s or 75s.  None with cancer.  1 sister died at 84.  Social history  She is married.  She has 3 daughters and 2 sons in good health.  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  She smokes cigarettes off and on only for 2 years but quit 7 years ago.  She does not drink alcohol.   Objective: Blood pressure (!) 113/55, pulse 62, temperature 98.3 F (36.8 C), temperature source Oral, height '5\' 2"'  (1.575 m), weight 174 lb 14.4 oz (79.3 kg). The patient is alert and in no acute distress. Conjunctiva is pink. Sclera is nonicteric Oropharyngeal mucosa is normal. Patient has upper and lower dentures in place. No neck masses or thyromegaly noted. Cardiac exam with regular rhythm normal S1 and S2. No murmur or gallop noted. Lungs are clear to auscultation. Abdomen is symmetrical.  On palpation is soft.  She has mild midepigastric tenderness. She has a Band-Aid covering an ulcer over right knee.  She has 1+ edema involving the right ankle.  Labs/studies Results:  Recent lab data as above. CT and ultrasound results as above.  Assessment:  Patient is 88 year old Caucasian female who presents with history of epigastric pain and elevated bilirubin and transaminases.  Work-up revealed cholelithiasis dilated bile duct with single stone.  CT suggested cirrhosis but ultrasound did not.  Platelet counts are normal. Patient would benefit from ERCP with sphincterotomy and stone extraction before considering  cholecystectomy. Procedure and risks reviewed with patient and her husband in detail along with potential complications.  They are both agreeable.    Recommendations  ERCP with sphincterotomy and stone extraction on 07/05/2021. Patient will have CBC, LFTs and serum lipase prior to ERCP. Patient advised to hold aspirin for now.

## 2021-07-04 NOTE — Progress Notes (Signed)
Reason for consultation  Choledocholithiasis  History of present illness  Patient is 88 year old Caucasian female who is referred through courtesy of Dr. Woody Seller for urgent consultation for therapeutic ERCP. Patient is accompanied by her husband Mortimer Fries. Patient says she was in usual state of health until about a month ago when she developed epigastric pain along with heaving pain lasted for more than 6 hours.  She did fine until 6 days ago when she noted pain in epigastric region which was quite intense.  She did not experience nausea vomiting fever or chills.  She was evaluated in the emergency room at [facility]. Lab studies revealed WBC of 19.4 with H&H of 11 and 32.9 and platelet count of 217K.  Her electrolytes were normal.  BUN was 49 creatinine 2.13.  Glucose was 351 her bilirubin was 3.4, AP 130, AST 339 ALT 385 and total protein 6.1 with albumin of 2.6.  Urine analysis revealed 15 WBCs per high-power field 20 RBCs and positive leukocyte Estrace.  She underwent abdominal pelvic CT which revealed cholelithiasis and suggested choledocholithiasis but image quality was not optimal.  2 small nonobstructive stones were noted in the left kidney morphologic changes to the liver suggestive of cirrhosis.  Surgery was consulted and recommended ERCP to clear bile duct stone prior to cholecystectomy.  Patient was discharged on antibiotic. She was seen at [facility] internal medicine on 06/30/2021 and underwent abdominal ultrasound yesterday which revealed cholelithiasis dilated bile duct measuring 11 mm along with 5 mm stone and liver morphology appeared to be normal. Patient is presently free of abdominal pain.  She states she has good appetite.  Her husband states she eats Cheerios all the time.  She has multiple snacks but not regular meals.  She denies weight loss.  Current Medications: Outpatient Encounter Medications as of 07/04/2021  Medication Sig   aspirin EC 81 MG tablet Take 81 mg by mouth daily.      Aspirin-Acetaminophen-Caffeine (EXCEDRIN MIGRAINE PO) Take by mouth as needed.   citalopram (CELEXA) 20 MG tablet Take 20 mg by mouth daily.   Cyanocobalamin 1000 MCG/ML KIT Inject as directed every 30 (thirty) days. Once a month   diltiazem (CARDIZEM CD) 240 MG 24 hr capsule Take 1 capsule (240 mg total) by mouth daily.   ferrous sulfate 325 (65 FE) MG tablet Take 325 mg by mouth 2 (two) times daily with a meal.   gabapentin (NEURONTIN) 300 MG capsule Take 300 mg by mouth daily.    glimepiride (AMARYL) 4 MG tablet Take 4 mg by mouth 2 (two) times daily.     glipiZIDE (GLUCOTROL) 5 MG tablet Take by mouth daily.   metFORMIN (GLUCOPHAGE) 500 MG tablet Take 500 mg by mouth. One daily   metoprolol succinate (TOPROL-XL) 100 MG 24 hr tablet Take 100 mg by mouth daily.   omeprazole (PRILOSEC) 20 MG capsule Take 20 mg by mouth 2 (two) times daily before a meal.   valsartan-hydrochlorothiazide (DIOVAN-HCT) 160-12.5 MG per tablet Take 1 tablet by mouth daily.     [DISCONTINUED] DICLOFENAC PO Take by mouth. 50 mg one bid with food prn leg pain.   [DISCONTINUED] meclizine (ANTIVERT) 12.5 MG tablet Take 12.5 mg by mouth 3 (three) times daily as needed for dizziness.   [DISCONTINUED] memantine (NAMENDA XR) 14 MG CP24 24 hr capsule Take 14 mg by mouth daily.   [DISCONTINUED] triamcinolone cream (KENALOG) 0.1 % Apply 1 application topically 2 (two) times daily. prn   furosemide (LASIX) 20 MG tablet Take 20 mg  by mouth. Prn leg swelling   ONGLYZA 5 MG TABS tablet Take 5 mg by mouth daily.  (Patient not taking: Reported on 07/04/2021)   potassium chloride (KLOR-CON) 10 MEQ tablet Take 10 mEq by mouth. One daily prn with lasix   rosuvastatin (CRESTOR) 10 MG tablet Take 10 mg by mouth daily.   No facility-administered encounter medications on file as of 07/04/2021.   Past medical history  Hypertension Hyperlipidemia History of premature ventricular contractions Diabetes mellitus of 20 years  duration Nonproliferative diabetic retinopathy left eye Chronic GERD Depression Obesity Chronic kidney disease. B12 deficiency History of kidney stones She had screening colonoscopy about 5 years ago and reportedly was normal. Osteoarthrosis of knees  Allergies  No Known Allergies  Family history  Mother lived to be 28 years old.  Father died at age 55.  She had 5 brothers and 4 sisters and they all lived to be in their 32s 80s or 24s.  None with cancer.  1 sister died at 42.  Social history  She is married.  She has 3 daughters and 2 sons in good health.  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  She smokes cigarettes off and on only for 2 years but quit 7 years ago.  She does not drink alcohol.   Objective: Blood pressure (!) 113/55, pulse 62, temperature 98.3 F (36.8 C), temperature source Oral, height '5\' 2"'  (1.575 m), weight 174 lb 14.4 oz (79.3 kg). The patient is alert and in no acute distress. Conjunctiva is pink. Sclera is nonicteric Oropharyngeal mucosa is normal. Patient has upper and lower dentures in place. No neck masses or thyromegaly noted. Cardiac exam with regular rhythm normal S1 and S2. No murmur or gallop noted. Lungs are clear to auscultation. Abdomen is symmetrical.  On palpation is soft.  She has mild midepigastric tenderness. She has a Band-Aid covering an ulcer over right knee.  She has 1+ edema involving the right ankle.  Labs/studies Results:  Recent lab data as above. CT and ultrasound results as above.  Assessment:  Patient is 88 year old Caucasian female who presents with history of epigastric pain and elevated bilirubin and transaminases.  Work-up revealed cholelithiasis dilated bile duct with single stone.  CT suggested cirrhosis but ultrasound did not.  Platelet counts are normal. Patient would benefit from ERCP with sphincterotomy and stone extraction before considering  cholecystectomy. Procedure and risks reviewed with patient and her husband in detail along with potential complications.  They are both agreeable.    Recommendations  ERCP with sphincterotomy and stone extraction on 07/05/2021. Patient will have CBC, LFTs and serum lipase prior to ERCP. Patient advised to hold aspirin for now.

## 2021-07-05 ENCOUNTER — Encounter (HOSPITAL_COMMUNITY): Payer: Self-pay | Admitting: Internal Medicine

## 2021-07-05 ENCOUNTER — Ambulatory Visit (HOSPITAL_BASED_OUTPATIENT_CLINIC_OR_DEPARTMENT_OTHER): Payer: Medicare Other | Admitting: Anesthesiology

## 2021-07-05 ENCOUNTER — Encounter (HOSPITAL_COMMUNITY)
Admission: RE | Disposition: A | Discharge status: Home / Self Care | Payer: Self-pay | Source: Ambulatory Visit | Attending: Internal Medicine

## 2021-07-05 ENCOUNTER — Ambulatory Visit (HOSPITAL_COMMUNITY)
Admission: RE | Admit: 2021-07-05 | Discharge: 2021-07-05 | Disposition: A | Discharge status: Home / Self Care | Payer: Medicare Other | Source: Ambulatory Visit | Attending: Internal Medicine | Admitting: Internal Medicine

## 2021-07-05 ENCOUNTER — Ambulatory Visit (HOSPITAL_COMMUNITY): Payer: Medicare Other

## 2021-07-05 ENCOUNTER — Ambulatory Visit (HOSPITAL_COMMUNITY): Payer: Medicare Other | Admitting: Anesthesiology

## 2021-07-05 DIAGNOSIS — I2510 Atherosclerotic heart disease of native coronary artery without angina pectoris: Secondary | ICD-10-CM

## 2021-07-05 DIAGNOSIS — I10 Essential (primary) hypertension: Secondary | ICD-10-CM | POA: Diagnosis not present

## 2021-07-05 DIAGNOSIS — N189 Chronic kidney disease, unspecified: Secondary | ICD-10-CM | POA: Insufficient documentation

## 2021-07-05 DIAGNOSIS — I129 Hypertensive chronic kidney disease with stage 1 through stage 4 chronic kidney disease, or unspecified chronic kidney disease: Secondary | ICD-10-CM | POA: Insufficient documentation

## 2021-07-05 DIAGNOSIS — E1122 Type 2 diabetes mellitus with diabetic chronic kidney disease: Secondary | ICD-10-CM | POA: Insufficient documentation

## 2021-07-05 DIAGNOSIS — Z7984 Long term (current) use of oral hypoglycemic drugs: Secondary | ICD-10-CM | POA: Insufficient documentation

## 2021-07-05 DIAGNOSIS — R932 Abnormal findings on diagnostic imaging of liver and biliary tract: Secondary | ICD-10-CM | POA: Diagnosis not present

## 2021-07-05 DIAGNOSIS — K219 Gastro-esophageal reflux disease without esophagitis: Secondary | ICD-10-CM | POA: Insufficient documentation

## 2021-07-05 DIAGNOSIS — M1990 Unspecified osteoarthritis, unspecified site: Secondary | ICD-10-CM | POA: Insufficient documentation

## 2021-07-05 DIAGNOSIS — I493 Ventricular premature depolarization: Secondary | ICD-10-CM | POA: Insufficient documentation

## 2021-07-05 DIAGNOSIS — K838 Other specified diseases of biliary tract: Secondary | ICD-10-CM | POA: Diagnosis not present

## 2021-07-05 DIAGNOSIS — Z87891 Personal history of nicotine dependence: Secondary | ICD-10-CM | POA: Diagnosis not present

## 2021-07-05 DIAGNOSIS — Z6831 Body mass index (BMI) 31.0-31.9, adult: Secondary | ICD-10-CM | POA: Insufficient documentation

## 2021-07-05 DIAGNOSIS — K8050 Calculus of bile duct without cholangitis or cholecystitis without obstruction: Secondary | ICD-10-CM

## 2021-07-05 DIAGNOSIS — Z01818 Encounter for other preprocedural examination: Secondary | ICD-10-CM

## 2021-07-05 DIAGNOSIS — K8070 Calculus of gallbladder and bile duct without cholecystitis without obstruction: Secondary | ICD-10-CM | POA: Insufficient documentation

## 2021-07-05 DIAGNOSIS — Z87442 Personal history of urinary calculi: Secondary | ICD-10-CM | POA: Insufficient documentation

## 2021-07-05 DIAGNOSIS — E669 Obesity, unspecified: Secondary | ICD-10-CM | POA: Insufficient documentation

## 2021-07-05 DIAGNOSIS — K6389 Other specified diseases of intestine: Secondary | ICD-10-CM | POA: Diagnosis not present

## 2021-07-05 HISTORY — PX: ERCP: SHX5425

## 2021-07-05 HISTORY — PX: SPHINCTEROTOMY: SHX5544

## 2021-07-05 HISTORY — PX: BILIARY STENT PLACEMENT: SHX5538

## 2021-07-05 LAB — HEPATIC FUNCTION PANEL
AG Ratio: 1.3 (calc) (ref 1.0–2.5)
ALT: 67 U/L — ABNORMAL HIGH (ref 6–29)
ALT: 60 U/L — ABNORMAL HIGH (ref 0–44)
AST: 19 U/L (ref 10–35)
AST: 21 U/L (ref 15–41)
Albumin: 3.3 g/dL — ABNORMAL LOW (ref 3.6–5.1)
Albumin: 3 g/dL — ABNORMAL LOW (ref 3.5–5.0)
Alkaline Phosphatase: 96 U/L (ref 38–126)
Alkaline phosphatase (APISO): 114 U/L (ref 37–153)
Bilirubin, Direct: 0.3 mg/dL — ABNORMAL HIGH (ref 0.0–0.2)
Bilirubin, Direct: 0.3 mg/dL — ABNORMAL HIGH (ref 0.0–0.2)
Globulin: 2.5 g/dL (calc) (ref 1.9–3.7)
Indirect Bilirubin: 0.3 mg/dL (calc) (ref 0.2–1.2)
Indirect Bilirubin: 0.3 mg/dL (ref 0.3–0.9)
Total Bilirubin: 0.6 mg/dL (ref 0.2–1.2)
Total Bilirubin: 0.6 mg/dL (ref 0.3–1.2)
Total Protein: 5.8 g/dL — ABNORMAL LOW (ref 6.1–8.1)
Total Protein: 6.4 g/dL — ABNORMAL LOW (ref 6.5–8.1)

## 2021-07-05 LAB — GLUCOSE, CAPILLARY: Glucose-Capillary: 209 mg/dL — ABNORMAL HIGH (ref 70–99)

## 2021-07-05 LAB — CBC WITH DIFFERENTIAL/PLATELET
Abs Immature Granulocytes: 0.08 10*3/uL — ABNORMAL HIGH (ref 0.00–0.07)
Basophils Absolute: 0.1 10*3/uL (ref 0.0–0.1)
Basophils Relative: 1 %
Eosinophils Absolute: 0.2 10*3/uL (ref 0.0–0.5)
Eosinophils Relative: 2 %
HCT: 34.9 % — ABNORMAL LOW (ref 36.0–46.0)
Hemoglobin: 11.2 g/dL — ABNORMAL LOW (ref 12.0–15.0)
Immature Granulocytes: 1 %
Lymphocytes Relative: 14 %
Lymphs Abs: 1.3 10*3/uL (ref 0.7–4.0)
MCH: 29.6 pg (ref 26.0–34.0)
MCHC: 32.1 g/dL (ref 30.0–36.0)
MCV: 92.3 fL (ref 80.0–100.0)
Monocytes Absolute: 0.6 10*3/uL (ref 0.1–1.0)
Monocytes Relative: 6 %
Neutro Abs: 7.4 10*3/uL (ref 1.7–7.7)
Neutrophils Relative %: 76 %
Platelets: 320 10*3/uL (ref 150–400)
RBC: 3.78 MIL/uL — ABNORMAL LOW (ref 3.87–5.11)
RDW: 13.8 % (ref 11.5–15.5)
WBC: 9.7 10*3/uL (ref 4.0–10.5)
nRBC: 0 % (ref 0.0–0.2)

## 2021-07-05 LAB — CBC
HCT: 34.8 % — ABNORMAL LOW (ref 35.0–45.0)
Hemoglobin: 11.3 g/dL — ABNORMAL LOW (ref 11.7–15.5)
MCH: 29.4 pg (ref 27.0–33.0)
MCHC: 32.5 g/dL (ref 32.0–36.0)
MCV: 90.6 fL (ref 80.0–100.0)
MPV: 9.6 fL (ref 7.5–12.5)
Platelets: 339 10*3/uL (ref 140–400)
RBC: 3.84 10*6/uL (ref 3.80–5.10)
RDW: 13.3 % (ref 11.0–15.0)
WBC: 10.9 10*3/uL — ABNORMAL HIGH (ref 3.8–10.8)

## 2021-07-05 LAB — LIPASE, BLOOD: Lipase: 51 U/L (ref 11–51)

## 2021-07-05 LAB — LIPASE: Lipase: 94 U/L — ABNORMAL HIGH (ref 7–60)

## 2021-07-05 SURGERY — ERCP, WITH INTERVENTION IF INDICATED
Anesthesia: General | Site: Esophagus

## 2021-07-05 MED ORDER — PROPOFOL 10 MG/ML IV BOLUS
INTRAVENOUS | Status: DC | PRN
  Administered 2021-07-05: 140 mg via INTRAVENOUS

## 2021-07-05 MED ORDER — CHLORHEXIDINE GLUCONATE 0.12 % MT SOLN
15.0000 mL | Freq: Once | OROMUCOSAL | Status: AC
  Administered 2021-07-05: 15 mL via OROMUCOSAL

## 2021-07-05 MED ORDER — LIDOCAINE HCL (CARDIAC) PF 100 MG/5ML IV SOSY
PREFILLED_SYRINGE | INTRAVENOUS | Status: DC | PRN
  Administered 2021-07-05: 80 mg via INTRAVENOUS

## 2021-07-05 MED ORDER — ORAL CARE MOUTH RINSE: 15.0000 mL | Freq: Once | OROMUCOSAL | Status: AC

## 2021-07-05 MED ORDER — CEFAZOLIN SODIUM-DEXTROSE 1-4 GM/50ML-% IV SOLN
1.0000 g | Freq: Once | INTRAVENOUS | Status: AC
Start: 2021-07-05 — End: 2021-07-05
  Administered 2021-07-05: 2 g via INTRAVENOUS

## 2021-07-05 MED ORDER — ROCURONIUM BROMIDE 10 MG/ML (PF) SYRINGE
PREFILLED_SYRINGE | INTRAVENOUS | Status: AC
  Filled 2021-07-05: qty 10

## 2021-07-05 MED ORDER — CEFAZOLIN SODIUM-DEXTROSE 1-4 GM/50ML-% IV SOLN
INTRAVENOUS | Status: AC
  Filled 2021-07-05: qty 50

## 2021-07-05 MED ORDER — PROPOFOL 10 MG/ML IV BOLUS
INTRAVENOUS | Status: AC
Start: 2021-07-05 — End: ?
  Filled 2021-07-05: qty 20

## 2021-07-05 MED ORDER — SUCCINYLCHOLINE CHLORIDE 200 MG/10ML IV SOSY
PREFILLED_SYRINGE | INTRAVENOUS | Status: DC | PRN
Start: 2021-07-05 — End: 2021-07-05
  Administered 2021-07-05: 100 mg via INTRAVENOUS

## 2021-07-05 MED ORDER — SODIUM CHLORIDE 0.9 % IV SOLN
INTRAVENOUS | Status: DC | PRN
  Administered 2021-07-05: 30 mL

## 2021-07-05 MED ORDER — SODIUM CHLORIDE 0.9 % IV SOLN
INTRAVENOUS | Status: AC
  Filled 2021-07-05: qty 50

## 2021-07-05 MED ORDER — FENTANYL CITRATE PF 50 MCG/ML IJ SOSY: 25.0000 ug | PREFILLED_SYRINGE | INTRAMUSCULAR | Status: DC | PRN

## 2021-07-05 MED ORDER — GLUCAGON HCL RDNA (DIAGNOSTIC) 1 MG IJ SOLR
INTRAMUSCULAR | Status: DC | PRN
  Administered 2021-07-05 (×4): .25 mg via INTRAVENOUS

## 2021-07-05 MED ORDER — ONDANSETRON HCL 4 MG/2ML IJ SOLN
INTRAMUSCULAR | Status: AC
  Filled 2021-07-05: qty 4

## 2021-07-05 MED ORDER — DEXAMETHASONE SODIUM PHOSPHATE 10 MG/ML IJ SOLN
INTRAMUSCULAR | Status: AC
  Filled 2021-07-05: qty 1

## 2021-07-05 MED ORDER — ONDANSETRON HCL 4 MG/2ML IJ SOLN
INTRAMUSCULAR | Status: DC | PRN
  Administered 2021-07-05: 4 mg via INTRAVENOUS

## 2021-07-05 MED ORDER — GLUCAGON HCL RDNA (DIAGNOSTIC) 1 MG IJ SOLR
INTRAMUSCULAR | Status: AC
  Filled 2021-07-05: qty 1

## 2021-07-05 MED ORDER — LACTATED RINGERS IV SOLN: INTRAVENOUS | Status: DC

## 2021-07-05 MED ORDER — LIDOCAINE HCL (PF) 2 % IJ SOLN
INTRAMUSCULAR | Status: AC
  Filled 2021-07-05: qty 5

## 2021-07-05 MED ORDER — SUCCINYLCHOLINE CHLORIDE 200 MG/10ML IV SOSY
PREFILLED_SYRINGE | INTRAVENOUS | Status: AC
  Filled 2021-07-05: qty 10

## 2021-07-05 MED ORDER — ONDANSETRON HCL 4 MG/2ML IJ SOLN: 4.0000 mg | Freq: Once | INTRAMUSCULAR | Status: DC | PRN

## 2021-07-05 MED ORDER — STERILE WATER FOR IRRIGATION IR SOLN
Status: DC | PRN
  Administered 2021-07-05: 1000 mL

## 2021-07-05 MED ORDER — PHENYLEPHRINE 40 MCG/ML (10ML) SYRINGE FOR IV PUSH (FOR BLOOD PRESSURE SUPPORT)
PREFILLED_SYRINGE | INTRAVENOUS | Status: AC
  Filled 2021-07-05: qty 20

## 2021-07-05 SURGICAL SUPPLY — 25 items
BALLN RETRIEVAL 12X15 (BALLOONS) IMPLANT
BALN RTRVL 200 6-7FR 12-15 (BALLOONS)
BASKET TRAPEZOID 3X6 (MISCELLANEOUS) IMPLANT
BASKET TRAPEZOID LITHO 2.0X5 (MISCELLANEOUS) ×1 IMPLANT
BSKT STON RTRVL TRAPEZOID 2X5 (MISCELLANEOUS)
BSKT STON RTRVL TRAPEZOID 3X6 (MISCELLANEOUS)
DEVICE INFLATION ENCORE 26 (MISCELLANEOUS) IMPLANT
DEVICE LOCKING W-BIOPSY CAP (MISCELLANEOUS) ×1 IMPLANT
GUIDEWIRE HYDRA JAGWIRE .35 (WIRE) IMPLANT
GUIDEWIRE JAG HINI 025X260CM (WIRE) IMPLANT
KIT ENDO PROCEDURE PEN (KITS) ×1 IMPLANT
KIT TURNOVER KIT A (KITS) ×3 IMPLANT
LUBRICANT JELLY 4.5OZ STERILE (MISCELLANEOUS) IMPLANT
PAD ARMBOARD 7.5X6 YLW CONV (MISCELLANEOUS) ×3 IMPLANT
POSITIONER HEAD 8X9X4 ADT (SOFTGOODS) IMPLANT
SCOPE SPY DS DISPOSABLE (MISCELLANEOUS) ×1 IMPLANT
SNARE ROTATE MED OVAL 20MM (MISCELLANEOUS) IMPLANT
SNARE SHORT THROW 13M SML OVAL (MISCELLANEOUS) IMPLANT
SPHINCTEROTOME AUTOTOME .25 (MISCELLANEOUS) IMPLANT
SPHINCTEROTOME HYDRATOME 44 (MISCELLANEOUS) ×1 IMPLANT
SYSTEM CONTINUOUS INJECTION (MISCELLANEOUS) ×1 IMPLANT
TUBING INSUFFLATOR CO2MPACT (TUBING) ×3 IMPLANT
WALLSTENT METAL COVERED 10X60 (STENTS) IMPLANT
WALLSTENT METAL COVERED 10X80 (STENTS) IMPLANT
WATER STERILE IRR 1000ML POUR (IV SOLUTION) ×3 IMPLANT

## 2021-07-05 NOTE — Op Note (Signed)
[facility] ?Patient Name: Christine Caldwell ?Procedure Date: 07/05/2021 2:07 PM ?MRN: [id number] ?Date of Birth: [birth year] ?Attending MD: Hildred Laser , MD ?CSN: [id number] ?Age: 88 ?Admit Type: Inpatient ?Procedure:                ERCP ?Indications:              For therapy of bile duct stone(s) ?Providers:                Hildred Laser, MD, Crystal Page, Gwenlyn Fudge, RN,  ?                          Randa Spike, Technician, Wynonia Musty Tech,  ?                          Technician ?Referring MD:             Glenda Chroman, MD ?Medicines:                General Anesthesia ?Complications:            No immediate complications. ?Estimated Blood Loss:     Estimated blood loss was minimal. ?Procedure:                Pre-Anesthesia Assessment: ?                          - Prior to the procedure, a History and Physical  ?                          was performed, and patient medications and  ?                          allergies were reviewed. The patient's tolerance of  ?                          previous anesthesia was also reviewed. The risks  ?                          and benefits of the procedure and the sedation  ?                          options and risks were discussed with the patient.  ?                          All questions were answered, and informed consent  ?                          was obtained. Prior Anticoagulants: The patient has  ?                          taken no previous anticoagulant or antiplatelet  ?                          agents except for aspirin. ASA Grade Assessment:  ?  III - A patient with severe systemic disease. After  ?                          reviewing the risks and benefits, the patient was  ?                          deemed in satisfactory condition to undergo the  ?                          procedure. ?                          After obtaining informed consent, the scope was  ?                          passed under direct vision. Throughout the  ?                           procedure, the patient's blood pressure, pulse, and  ?                          oxygen saturations were monitored continuously. The  ?                          [employer] D single use  ?                          duodenoscope was introduced through the mouth, and  ?                          used to inject contrast into and used to inject  ?                          contrast into the bile duct. The ERCP was  ?                          accomplished without difficulty. The patient  ?                          tolerated the procedure well. ?Scope In: ?Scope Out: ?Findings: ?     The scout film was normal. The esophagus was successfully intubated  ?     under direct vision. The scope was advanced to a normal major papilla in  ?     the descending duodenum without detailed examination of the pharynx,  ?     larynx and associated structures, and upper GI tract. The upper GI tract  ?     was grossly normal. The bile duct was deeply cannulated with the  ?     Autotome sphincterotome. Contrast was injected. I personally interpreted  ?     the bile duct images. There was brisk flow of contrast through the  ?     ducts. Image quality was excellent. Contrast extended to the entire  ?     biliary tree. The common bile duct and common hepatic duct were severely  ?  dilated and diffusely dilated. The largest diameter was 18 mm. The  ?     common bile duct contained filling defect(s) thought to be a stone. The  ?     biliary sphincterotomy was extended to a total of 7 mm in length with a  ?     braided Autotome sphincterotome using ERBE electrocautery. The  ?     sphincterotomy oozed blood. Dilation of major papilla with an 12-13-08 mm  ?     balloon (to a maximum balloon size of 10 mm) dilator was successful. The  ?     biliary tree was swept with a 25 mm balloon and basket starting at the  ?     bifurcation. Two stones were removed. Two stones remained. One 10 Fr by  ?     7 cm plastic stent  with a single external flap and a single internal  ?     flap was placed into the common bile duct. Bile flowed through the  ?     stent. The stent was placed downstream (towards the lumen of the GI  ?     tract). ?Impression:               - Filling defects consistent with a stones was seen  ?                          on the cholangiogram. ?                          - The common bile duct and common hepatic duct were  ?                          severely dilated. ?                          - Biliary sphincterotomy performed and further  ?                          dilated to 10 mm with a balloon. ?                          - Two stones were removed using Damia basket.  ?                          Unable to remove third stone with vascular balloon. ?                          - One plastic stent was placed into the common bile  ?                          duct. ?Moderate Sedation: ?     Per Anesthesia Care ?Recommendation:           - Discharge patient to home (with spouse). ?                          - Clear liquid diet today. ?                          -  Avoid aspirin or anticoagulants for 3 days. ?                          - Continue present medications. ?                          - Repeat ERCP in 4 to 6 weeks. ?Procedure Code(s):        --- Professional --- ?                          [phone], Endoscopic retrograde  ?                          cholangiopancreatography (ERCP); with placement of  ?                          endoscopic stent into biliary or pancreatic duct,  ?                          including pre- and post-dilation and guide wire  ?                          passage, when performed, including sphincterotomy,  ?                          when performed, each stent ?                          [id number], Endoscopic retrograde  ?                          cholangiopancreatography (ERCP); with removal of  ?                          calculi/debris from biliary/pancreatic duct(s) ?Diagnosis Code(s):        ---  Professional --- ?                          K80.50, Calculus of bile duct without cholangitis  ?                          or cholecystitis without obstruction ?                          K83.8, Other specified diseases of biliary tract ?                          R93.2, Abnormal findings on diagnostic imaging of  ?                          liver and biliary tract ?CPT copyright 2019 American Medical Association. All rights reserved. ?The codes documented in this report are preliminary and upon coder review may  ?be revised to meet current compliance requirements. ?Hildred Laser, MD ?Hildred Laser, MD ?07/05/2021 4:13:35 PM ?This report has been signed electronically. ?Number of Addenda: 0 ?

## 2021-07-05 NOTE — Anesthesia Procedure Notes (Addendum)
Procedure Name: Intubation ?Date/Time: 07/05/2021 2:20 PM ?Performed by: Maude Leriche, CRNA ?Pre-anesthesia Checklist: Patient identified, Emergency Drugs available, Suction available and Patient being monitored ?Patient Re-evaluated:Patient Re-evaluated prior to induction ?Oxygen Delivery Method: Circle system utilized ?Preoxygenation: Pre-oxygenation with 100% oxygen ?Induction Type: IV induction ?Ventilation: Mask ventilation without difficulty ?Laryngoscope Size: Sabra Heck and 2 ?Grade View: Grade I ?Tube type: Oral ?Tube size: 6.5 mm ?Number of attempts: 1 ?Airway Equipment and Method: Stylet and Bite block ?Placement Confirmation: ETT inserted through vocal cords under direct vision, positive ETCO2 and breath sounds checked- equal and bilateral ?Secured at: 21 cm ?Tube secured with: Tape ?Dental Injury: Teeth and Oropharynx as per pre-operative assessment  ?Comments: Endo Bite block placed by RN ? ? ? ? ?

## 2021-07-05 NOTE — Progress Notes (Signed)
Brief ERCP note. ? ?Normal ampulla of Vater. ?CBD cannulated easily with Rx 44 autotome and 035 Hydra Jagwire ?Markedly dilated CBD and CHD measuring about 18 mm in maximal diameter.   ?3 large filling filling defect/stones noted. ?Biliary sphincterotomy performed.  Sphincterotomy dilated with balloon to 10 mm. ?2 stones were removed using Dornier basket.  Third stone could not be removed. ?Therefore 10 French 7 cm plastic stent left in place for bili decompression. ?Patient tolerated the procedure well. ?

## 2021-07-05 NOTE — Anesthesia Preprocedure Evaluation (Addendum)
Anesthesia Evaluation  ?Patient identified by MRN, date of birth, ID band ?Patient awake ? ? ? ?Reviewed: ?Allergy & Precautions, NPO status , Patient's Chart, lab work & pertinent test results, reviewed documented beta blocker date and time  ? ?History of Anesthesia Complications ?Negative for: history of anesthetic complications ? ?Airway ?Mallampati: II ? ?TM Distance: >3 FB ?Neck ROM: Full ? ? ? Dental ? ?(+) Upper Dentures, Lower Dentures ?  ?Pulmonary ?neg pulmonary ROS, former smoker,  ?  ?Pulmonary exam normal ?breath sounds clear to auscultation ? ? ? ? ? ? Cardiovascular ?hypertension, Pt. on medications and Pt. on home beta blockers ?+ CAD  ?Normal cardiovascular exam+ dysrhythmias (PVCs)  ?Rhythm:Regular Rate:Normal ? ? ?  ?Neuro/Psych ?negative neurological ROS ? negative psych ROS  ? GI/Hepatic ?Neg liver ROS, GERD  Medicated and Controlled,  ?Endo/Other  ?negative endocrine ROSdiabetes, Well Controlled, Type 2, Oral Hypoglycemic Agents ? Renal/GU ?negative Renal ROS  ?negative genitourinary ?  ?Musculoskeletal ? ?(+) Arthritis , Osteoarthritis,   ? Abdominal ?  ?Peds ?negative pediatric ROS ?(+)  Hematology ?negative hematology ROS ?(+)   ?Anesthesia Other Findings ? ? Reproductive/Obstetrics ?negative OB ROS ? ?  ? ? ? ? ? ? ? ? ? ? ? ? ? ?  ?  ? ? ? ? ? ?Anesthesia Physical ?Anesthesia Plan ? ?ASA: 3 ? ?Anesthesia Plan: General  ? ?Post-op Pain Management: Dilaudid IV  ? ?Induction: Intravenous ? ?PONV Risk Score and Plan: 4 or greater and Ondansetron ? ?Airway Management Planned: Oral ETT ? ?Additional Equipment:  ? ?Intra-op Plan:  ? ?Post-operative Plan: Extubation in OR ? ?Informed Consent: I have reviewed the patients History and Physical, chart, labs and discussed the procedure including the risks, benefits and alternatives for the proposed anesthesia with the patient or authorized representative who has indicated his/her understanding and acceptance.   ? ? ? ?Dental advisory given ? ?Plan Discussed with: CRNA and Surgeon ? ?Anesthesia Plan Comments:   ? ? ? ? ? ?Anesthesia Quick Evaluation ? ?

## 2021-07-05 NOTE — Discharge Instructions (Addendum)
Resume aspirin 07/08/2021. ?Do not take Excedrin for the next 3 days. ?Hold evening dose of glimepiride. ?Resume other medications as before. ?Clear liquids today. ?Resume usual diet starting tomorrow. ?Will arrange for repeat ERCP in 4 to 6 weeks. ? ?

## 2021-07-05 NOTE — Transfer of Care (Signed)
Immediate Anesthesia Transfer of Care Note ? ?Patient: Christine Caldwell ? ?Procedure(s) Performed: ENDOSCOPIC RETROGRADE CHOLANGIOPANCREATOGRAPHY (ERCP) (Esophagus) ?SPHINCTEROTOMY (Esophagus) ?biliary stent placement (Esophagus) ? ?Patient Location: PACU ? ?Anesthesia Type:General ? ?Level of Consciousness: awake, alert , oriented and sedated ? ?Airway & Oxygen Therapy: Patient Spontanous Breathing and Patient connected to nasal cannula oxygen ? ?Post-op Assessment: Report given to RN and Post -op Vital signs reviewed and stable ? ?Post vital signs: Reviewed and stable ? ?Last Vitals:  ?Vitals Value Taken Time  ?BP 120/74 07/05/21 1530  ?Temp 97.9 1535  ?Pulse 71 07/05/21 1535  ?Resp 21 07/05/21 1535  ?SpO2 100 % 07/05/21 1535  ?Vitals shown include unvalidated device data. ? ?Last Pain:  ?Vitals:  ? 07/05/21 1214  ?TempSrc: Oral  ?PainSc: 0-No pain  ?   ? ?Patients Stated Pain Goal: 5 (07/05/21 1214) ? ?Complications: No notable events documented. ?

## 2021-07-05 NOTE — Anesthesia Postprocedure Evaluation (Signed)
Anesthesia Post Note ? ?Patient: SHARIKA MOSQUERA ? ?Procedure(s) Performed: ENDOSCOPIC RETROGRADE CHOLANGIOPANCREATOGRAPHY (ERCP) (Esophagus) ?SPHINCTEROTOMY with stone two extraction (Esophagus) ?biliary stent placement (Esophagus) ? ?Patient location during evaluation: PACU ?Anesthesia Type: General ?Level of consciousness: awake and alert and oriented ?Pain management: pain level controlled ?Vital Signs Assessment: post-procedure vital signs reviewed and stable ?Respiratory status: respiratory function stable ?Cardiovascular status: blood pressure returned to baseline and stable ?Postop Assessment: no apparent nausea or vomiting ?Anesthetic complications: no ? ? ?No notable events documented. ? ? ?Last Vitals:  ?Vitals:  ? 07/05/21 1551 07/05/21 1552  ?BP:  (!) 142/68  ?Pulse: 70   ?Resp: 17   ?Temp: 36.6 ?C   ?SpO2: 98%   ?  ?Last Pain:  ?Vitals:  ? 07/05/21 1551  ?TempSrc: Oral  ?PainSc: 0-No pain  ? ? ?  ?  ?  ?  ?  ?  ? ?Eilam Shrewsbury C Melizza Kanode ? ? ? ? ?

## 2021-07-05 NOTE — Interval H&P Note (Signed)
Patient has no complaints today.  She says epigastric pain has resolved. ?Her examination reveals soft abdomen without tenderness organomegaly or masses.  Cardiac exam with regular rhythm normal S1-S2.  Faint systolic murmur noted in aortic area.  Auscultation lungs reveal vesicular breath sounds. ?Serum lipase is normal.  Transaminases and bilirubin are almost back to normal. ?History and Physical Interval Note: ? ?07/05/2021 ?1:51 PM ? ?Christine Caldwell  has presented today for surgery, with the diagnosis of Common Bile Duct Stone.  The various methods of treatment have been discussed with the patient and family. After consideration of risks, benefits and other options for treatment, the patient has consented to  Procedure(s) with comments: ?ENDOSCOPIC RETROGRADE CHOLANGIOPANCREATOGRAPHY (ERCP) (N/A) - 215 - pt to arrive at 11:30, office to tell NPO after midnight, Dr. Laural Golden to advise pt meds to take prior to procedure - OK per [name] ?SPHINCTEROTOMY (N/A) ?GASTROINTESTINAL STENT REMOVAL (N/A) as a surgical intervention.  The patient's history has been reviewed, patient examined, no change in status, stable for surgery.  I have reviewed the patient's chart and labs.  Questions were answered to the patient's satisfaction.   ? ? ?[name] ? ? ?

## 2021-07-10 ENCOUNTER — Encounter (HOSPITAL_COMMUNITY): Payer: Self-pay | Admitting: Internal Medicine

## 2021-07-19 DIAGNOSIS — E1165 Type 2 diabetes mellitus with hyperglycemia: Secondary | ICD-10-CM | POA: Diagnosis not present

## 2021-07-19 DIAGNOSIS — Z299 Encounter for prophylactic measures, unspecified: Secondary | ICD-10-CM | POA: Diagnosis not present

## 2021-07-19 DIAGNOSIS — I10 Essential (primary) hypertension: Secondary | ICD-10-CM | POA: Diagnosis not present

## 2021-07-19 DIAGNOSIS — E113292 Type 2 diabetes mellitus with mild nonproliferative diabetic retinopathy without macular edema, left eye: Secondary | ICD-10-CM | POA: Diagnosis not present

## 2021-08-03 DIAGNOSIS — E119 Type 2 diabetes mellitus without complications: Secondary | ICD-10-CM | POA: Diagnosis not present

## 2021-08-03 DIAGNOSIS — L258 Unspecified contact dermatitis due to other agents: Secondary | ICD-10-CM | POA: Diagnosis not present

## 2021-08-03 DIAGNOSIS — C44612 Basal cell carcinoma of skin of right upper limb, including shoulder: Secondary | ICD-10-CM | POA: Diagnosis not present

## 2021-08-03 DIAGNOSIS — Z08 Encounter for follow-up examination after completed treatment for malignant neoplasm: Secondary | ICD-10-CM | POA: Diagnosis not present

## 2021-08-03 DIAGNOSIS — Z85828 Personal history of other malignant neoplasm of skin: Secondary | ICD-10-CM | POA: Diagnosis not present

## 2021-08-07 ENCOUNTER — Encounter (INDEPENDENT_AMBULATORY_CARE_PROVIDER_SITE_OTHER): Payer: Self-pay

## 2021-08-08 ENCOUNTER — Encounter (INDEPENDENT_AMBULATORY_CARE_PROVIDER_SITE_OTHER): Payer: Self-pay

## 2021-08-08 ENCOUNTER — Other Ambulatory Visit (INDEPENDENT_AMBULATORY_CARE_PROVIDER_SITE_OTHER): Payer: Self-pay

## 2021-08-08 DIAGNOSIS — K8050 Calculus of bile duct without cholangitis or cholecystitis without obstruction: Secondary | ICD-10-CM

## 2021-08-09 ENCOUNTER — Encounter (INDEPENDENT_AMBULATORY_CARE_PROVIDER_SITE_OTHER): Payer: Self-pay

## 2021-08-18 ENCOUNTER — Other Ambulatory Visit (HOSPITAL_COMMUNITY): Payer: Self-pay | Admitting: Orthopedic Surgery

## 2021-08-18 DIAGNOSIS — M545 Low back pain, unspecified: Secondary | ICD-10-CM

## 2021-08-18 DIAGNOSIS — M5416 Radiculopathy, lumbar region: Secondary | ICD-10-CM | POA: Diagnosis not present

## 2021-08-18 DIAGNOSIS — M25552 Pain in left hip: Secondary | ICD-10-CM | POA: Diagnosis not present

## 2021-08-23 DIAGNOSIS — Z789 Other specified health status: Secondary | ICD-10-CM | POA: Diagnosis not present

## 2021-08-23 DIAGNOSIS — E1140 Type 2 diabetes mellitus with diabetic neuropathy, unspecified: Secondary | ICD-10-CM | POA: Diagnosis not present

## 2021-08-23 DIAGNOSIS — Z299 Encounter for prophylactic measures, unspecified: Secondary | ICD-10-CM | POA: Diagnosis not present

## 2021-08-23 DIAGNOSIS — Z0000 Encounter for general adult medical examination without abnormal findings: Secondary | ICD-10-CM | POA: Diagnosis not present

## 2021-08-23 DIAGNOSIS — Z7189 Other specified counseling: Secondary | ICD-10-CM | POA: Diagnosis not present

## 2021-08-23 DIAGNOSIS — E1122 Type 2 diabetes mellitus with diabetic chronic kidney disease: Secondary | ICD-10-CM | POA: Diagnosis not present

## 2021-08-23 DIAGNOSIS — I10 Essential (primary) hypertension: Secondary | ICD-10-CM | POA: Diagnosis not present

## 2021-08-23 DIAGNOSIS — E538 Deficiency of other specified B group vitamins: Secondary | ICD-10-CM | POA: Diagnosis not present

## 2021-09-03 DIAGNOSIS — E119 Type 2 diabetes mellitus without complications: Secondary | ICD-10-CM | POA: Diagnosis not present

## 2021-09-04 ENCOUNTER — Other Ambulatory Visit (INDEPENDENT_AMBULATORY_CARE_PROVIDER_SITE_OTHER): Payer: Self-pay

## 2021-09-04 ENCOUNTER — Encounter (HOSPITAL_COMMUNITY)
Admission: RE | Admit: 2021-09-04 | Discharge: 2021-09-04 | Disposition: A | Discharge status: Home / Self Care | Payer: Medicare Other | Source: Ambulatory Visit | Attending: Internal Medicine | Admitting: Internal Medicine

## 2021-09-04 VITALS — BP 124/54 | HR 63 | Temp 97.5°F | Resp 18 | Ht 62.0 in | Wt 170.0 lb

## 2021-09-04 DIAGNOSIS — Z01812 Encounter for preprocedural laboratory examination: Secondary | ICD-10-CM

## 2021-09-04 DIAGNOSIS — E119 Type 2 diabetes mellitus without complications: Secondary | ICD-10-CM | POA: Insufficient documentation

## 2021-09-04 LAB — COMPREHENSIVE METABOLIC PANEL
ALT: 11 U/L (ref 0–44)
AST: 12 U/L — ABNORMAL LOW (ref 15–41)
Albumin: 3 g/dL — ABNORMAL LOW (ref 3.5–5.0)
Alkaline Phosphatase: 69 U/L (ref 38–126)
Anion gap: 7 (ref 5–15)
BUN: 44 mg/dL — ABNORMAL HIGH (ref 8–23)
CO2: 22 mmol/L (ref 22–32)
Calcium: 8.6 mg/dL — ABNORMAL LOW (ref 8.9–10.3)
Chloride: 110 mmol/L (ref 98–111)
Creatinine, Ser: 1.66 mg/dL — ABNORMAL HIGH (ref 0.44–1.00)
GFR, Estimated: 30 mL/min — ABNORMAL LOW (ref >60)
Glucose, Bld: 217 mg/dL — ABNORMAL HIGH (ref 70–99)
Potassium: 4 mmol/L (ref 3.5–5.1)
Sodium: 139 mmol/L (ref 135–145)
Total Bilirubin: 0.2 mg/dL — ABNORMAL LOW (ref 0.3–1.2)
Total Protein: 6.2 g/dL — ABNORMAL LOW (ref 6.5–8.1)

## 2021-09-04 NOTE — Patient Instructions (Signed)
? ? ? ? ? ? Christine Caldwell ? 09/04/2021  ?  ? '@PREFPERIOPPHARMACY'$ @ ? ? Your procedure is scheduled on  09/06/2021. ? ? Report to [facility] at  1200 P.M. ? ? Call this number if you have problems the morning of surgery: ? [phone] ? ? Remember: ? Do not eat or drink after midnight. ?  ? ?  DO NOT take any medications for diabetes the morning of your procedure. ?  ? Take these medicines the morning of surgery with A SIP OF WATER   ? ?                   celexa, diltiazem, metoprolol, prilosec. ?  ? Do not wear jewelry, make-up or nail polish. ? Do not wear lotions, powders, or perfumes, or deodorant. ? Do not shave 48 hours prior to surgery.  Men may shave face and neck. ? Do not bring valuables to the hospital. ? [facility] is not responsible for any belongings or valuables. ? ?Contacts, dentures or bridgework may not be worn into surgery.  Leave your suitcase in the car.  After surgery it may be brought to your room. ? ?For patients admitted to the hospital, discharge time will be determined by your treatment team. ? ?Patients discharged the day of surgery will not be allowed to drive home and must  have someone with them for 24 hours.  ? ? ?Special instructions:   DO NOT smoke tobacco or vape for 24 hours before your procedure. ? ?Please read over the following fact sheets that you were given. ?Anesthesia Post-op Instructions and Care and Recovery After Surgery ?  ? ? ? Endoscopic Retrograde Cholangiopancreatogram, Care After ?The following information offers guidance on how to care for yourself after your procedure. Your health care provider may also give you more specific instructions. If you have problems or questions, contact your health care provider. ?What can I expect after the procedure? ?After the procedure, it is common to have: ?Soreness in your throat. ?Nausea. ?Bloating. ?Follow these instructions at home: ?Medicines ?Take over-the-counter and prescription medicines only as told by your health care  provider. ?If you were prescribed an antibiotic medicine, take it as told by your health care provider. Do not stop using the antibiotic even if you start to feel better. ?General instructions ? ?If you were given a sedative during the procedure, it can affect you for several hours. Do not drive or operate machinery until your health care provider says that it is safe. ?Have a responsible adult care for you for the time you are told. ?Return to your normal activities as told by your health care provider. Ask your health care provider what activities are safe for you. ?Return to eating what you normally do as soon as you feel well enough or as told by your health care provider. ?Keep all follow-up visits. This is important. ?Contact a health care provider if: ?You have pain in your abdomen that does not get better with medicine. ?You develop signs of infection, such as chills or a fever. ?Get help right away if: ?You have difficulty swallowing. ?You have worsening pain in your throat, chest, or abdomen. ?You vomit bright red blood or a substance that looks like coffee grounds. ?You have bloody or black, tarry stools. ?You have a sudden increase in swelling (bloating) in your abdomen. ?These symptoms may be an emergency. Get help right away. Call 911. ?Do not wait to see if the symptoms will  go away. ?Do not drive yourself to the hospital. ?Summary ?After the procedure, it is common to have some soreness in your throat or some bloating of your abdomen. ?If you were given a sedative during the procedure, it can affect you for several hours. Do not drive or operate machinery until your health care provider says that it is safe. ?Contact your health care provider if you have signs of infection, such as chills or a fever, or if you have pain that does not improve with medicine. ?Get help right away if you have trouble swallowing, worsening pain, bloody or black vomit, bloody or black stools, or increased swelling in your  abdomen. ?This information is not intended to replace advice given to you by your health care provider. Make sure you discuss any questions you have with your health care provider. ?Document Revised: 10/31/2020 Document Reviewed: 10/31/2020 ?Elsevier Patient Education ? [place]. ?General Anesthesia, Adult, Care After ?This sheet gives you information about how to care for yourself after your procedure. Your health care provider may also give you more specific instructions. If you have problems or questions, contact your health care provider. ?What can I expect after the procedure? ?After the procedure, the following side effects are common: ?Pain or discomfort at the IV site. ?Nausea. ?Vomiting. ?Sore throat. ?Trouble concentrating. ?Feeling cold or chills. ?Feeling weak or tired. ?Sleepiness and fatigue. ?Soreness and body aches. These side effects can affect parts of the body that were not involved in surgery. ?Follow these instructions at home: ?For the time period you were told by your health care provider: ? ?Rest. ?Do not participate in activities where you could fall or become injured. ?Do not drive or use machinery. ?Do not drink alcohol. ?Do not take sleeping pills or medicines that cause drowsiness. ?Do not make important decisions or sign legal documents. ?Do not take care of children on your own. ?Eating and drinking ?Follow any instructions from your health care provider about eating or drinking restrictions. ?When you feel hungry, start by eating small amounts of foods that are soft and easy to digest (bland), such as toast. Gradually return to your regular diet. ?Drink enough fluid to keep your urine pale yellow. ?If you vomit, rehydrate by drinking water, juice, or clear broth. ?General instructions ?If you have sleep apnea, surgery and certain medicines can increase your risk for breathing problems. Follow instructions from your health care provider about wearing your sleep  device: ?Anytime you are sleeping, including during daytime naps. ?While taking prescription pain medicines, sleeping medicines, or medicines that make you drowsy. ?Have a responsible adult stay with you for the time you are told. It is important to have someone help care for you until you are awake and alert. ?Return to your normal activities as told by your health care provider. Ask your health care provider what activities are safe for you. ?Take over-the-counter and prescription medicines only as told by your health care provider. ?If you smoke, do not smoke without supervision. ?Keep all follow-up visits as told by your health care provider. This is important. ?Contact a health care provider if: ?You have nausea or vomiting that does not get better with medicine. ?You cannot eat or drink without vomiting. ?You have pain that does not get better with medicine. ?You are unable to pass urine. ?You develop a skin rash. ?You have a fever. ?You have redness around your IV site that gets worse. ?Get help right away if: ?You have difficulty breathing. ?You have  chest pain. ?You have blood in your urine or stool, or you vomit blood. ?Summary ?After the procedure, it is common to have a sore throat or nausea. It is also common to feel tired. ?Have a responsible adult stay with you for the time you are told. It is important to have someone help care for you until you are awake and alert. ?When you feel hungry, start by eating small amounts of foods that are soft and easy to digest (bland), such as toast. Gradually return to your regular diet. ?Drink enough fluid to keep your urine pale yellow. ?Return to your normal activities as told by your health care provider. Ask your health care provider what activities are safe for you. ?This information is not intended to replace advice given to you by your health care provider. Make sure you discuss any questions you have with your health care provider. ?Document Revised:  01/07/2020 Document Reviewed: 08/06/2019 ?Elsevier Patient Education ? [place]. ? ?

## 2021-09-06 ENCOUNTER — Ambulatory Visit (HOSPITAL_BASED_OUTPATIENT_CLINIC_OR_DEPARTMENT_OTHER): Payer: Medicare Other | Admitting: Anesthesiology

## 2021-09-06 ENCOUNTER — Encounter (HOSPITAL_COMMUNITY): Payer: Self-pay | Admitting: Internal Medicine

## 2021-09-06 ENCOUNTER — Ambulatory Visit (HOSPITAL_COMMUNITY): Payer: Medicare Other

## 2021-09-06 ENCOUNTER — Ambulatory Visit (HOSPITAL_COMMUNITY): Payer: Medicare Other | Admitting: Anesthesiology

## 2021-09-06 ENCOUNTER — Ambulatory Visit (HOSPITAL_COMMUNITY)
Admission: RE | Admit: 2021-09-06 | Discharge: 2021-09-06 | Disposition: A | Discharge status: Home / Self Care | Payer: Medicare Other | Source: Ambulatory Visit | Attending: Internal Medicine | Admitting: Internal Medicine

## 2021-09-06 ENCOUNTER — Encounter (HOSPITAL_COMMUNITY)
Admission: RE | Disposition: A | Discharge status: Home / Self Care | Payer: Self-pay | Source: Ambulatory Visit | Attending: Internal Medicine

## 2021-09-06 DIAGNOSIS — K317 Polyp of stomach and duodenum: Secondary | ICD-10-CM | POA: Insufficient documentation

## 2021-09-06 DIAGNOSIS — K2900 Acute gastritis without bleeding: Secondary | ICD-10-CM | POA: Diagnosis not present

## 2021-09-06 DIAGNOSIS — E119 Type 2 diabetes mellitus without complications: Secondary | ICD-10-CM | POA: Diagnosis not present

## 2021-09-06 DIAGNOSIS — Z87891 Personal history of nicotine dependence: Secondary | ICD-10-CM | POA: Diagnosis not present

## 2021-09-06 DIAGNOSIS — I10 Essential (primary) hypertension: Secondary | ICD-10-CM | POA: Diagnosis not present

## 2021-09-06 DIAGNOSIS — Z4659 Encounter for fitting and adjustment of other gastrointestinal appliance and device: Secondary | ICD-10-CM | POA: Insufficient documentation

## 2021-09-06 DIAGNOSIS — Z0389 Encounter for observation for other suspected diseases and conditions ruled out: Secondary | ICD-10-CM | POA: Diagnosis not present

## 2021-09-06 DIAGNOSIS — K838 Other specified diseases of biliary tract: Secondary | ICD-10-CM | POA: Diagnosis not present

## 2021-09-06 DIAGNOSIS — K8050 Calculus of bile duct without cholangitis or cholecystitis without obstruction: Secondary | ICD-10-CM

## 2021-09-06 DIAGNOSIS — Z01812 Encounter for preprocedural laboratory examination: Secondary | ICD-10-CM

## 2021-09-06 HISTORY — PX: GASTROINTESTINAL STENT REMOVAL: SHX6384

## 2021-09-06 HISTORY — PX: ESOPHAGOGASTRODUODENOSCOPY: SHX5428

## 2021-09-06 HISTORY — PX: REMOVAL OF STONES: SHX5545

## 2021-09-06 HISTORY — PX: SPHINCTEROTOMY: SHX5544

## 2021-09-06 HISTORY — PX: ERCP: SHX5425

## 2021-09-06 LAB — GLUCOSE, CAPILLARY: Glucose-Capillary: 188 mg/dL — ABNORMAL HIGH (ref 70–99)

## 2021-09-06 SURGERY — ERCP, WITH INTERVENTION IF INDICATED
Anesthesia: General | Site: Esophagus

## 2021-09-06 MED ORDER — STERILE WATER FOR IRRIGATION IR SOLN
Status: DC | PRN
  Administered 2021-09-06: 1000 mL

## 2021-09-06 MED ORDER — SODIUM CHLORIDE 0.9 % IV SOLN
INTRAVENOUS | Status: DC | PRN
  Administered 2021-09-06: 26 mL

## 2021-09-06 MED ORDER — ONDANSETRON HCL 4 MG/2ML IJ SOLN: 4.0000 mg | Freq: Once | INTRAMUSCULAR | Status: DC | PRN

## 2021-09-06 MED ORDER — LACTATED RINGERS IV SOLN: INTRAVENOUS | Status: DC | PRN

## 2021-09-06 MED ORDER — ONDANSETRON HCL 4 MG/2ML IJ SOLN
INTRAMUSCULAR | Status: DC | PRN
  Administered 2021-09-06: 4 mg via INTRAVENOUS

## 2021-09-06 MED ORDER — FENTANYL CITRATE (PF) 100 MCG/2ML IJ SOLN
INTRAMUSCULAR | Status: DC | PRN
  Administered 2021-09-06: 50 ug via INTRAVENOUS

## 2021-09-06 MED ORDER — LIDOCAINE HCL (PF) 2 % IJ SOLN
INTRAMUSCULAR | Status: AC
Start: 2021-09-06 — End: ?
  Filled 2021-09-06: qty 5

## 2021-09-06 MED ORDER — CHLORHEXIDINE GLUCONATE CLOTH 2 % EX PADS: 6.0000 | MEDICATED_PAD | Freq: Once | CUTANEOUS | Status: DC

## 2021-09-06 MED ORDER — CHLORHEXIDINE GLUCONATE 0.12 % MT SOLN: 15.0000 mL | Freq: Once | OROMUCOSAL | Status: DC

## 2021-09-06 MED ORDER — EPHEDRINE SULFATE-NACL 50-0.9 MG/10ML-% IV SOSY
PREFILLED_SYRINGE | INTRAVENOUS | Status: DC | PRN
  Administered 2021-09-06: 5 mg via INTRAVENOUS
  Administered 2021-09-06: 10 mg via INTRAVENOUS

## 2021-09-06 MED ORDER — LACTATED RINGERS IV SOLN: INTRAVENOUS | Status: DC

## 2021-09-06 MED ORDER — SUCCINYLCHOLINE CHLORIDE 200 MG/10ML IV SOSY
PREFILLED_SYRINGE | INTRAVENOUS | Status: AC
Start: 2021-09-06 — End: ?
  Filled 2021-09-06: qty 10

## 2021-09-06 MED ORDER — LIDOCAINE 2% (20 MG/ML) 5 ML SYRINGE
INTRAMUSCULAR | Status: DC | PRN
Start: 2021-09-06 — End: 2021-09-06
  Administered 2021-09-06: 80 mg via INTRAVENOUS

## 2021-09-06 MED ORDER — CEFAZOLIN SODIUM-DEXTROSE 2-4 GM/100ML-% IV SOLN
2.0000 g | INTRAVENOUS | Status: AC
  Administered 2021-09-06: 2 g via INTRAVENOUS
  Filled 2021-09-06: qty 100

## 2021-09-06 MED ORDER — GLUCAGON HCL RDNA (DIAGNOSTIC) 1 MG IJ SOLR
INTRAMUSCULAR | Status: AC
  Filled 2021-09-06: qty 1

## 2021-09-06 MED ORDER — ORAL CARE MOUTH RINSE: 15.0000 mL | Freq: Once | OROMUCOSAL | Status: DC

## 2021-09-06 MED ORDER — SODIUM CHLORIDE 0.9 % IV SOLN: INTRAVENOUS | Status: DC

## 2021-09-06 MED ORDER — PHENYLEPHRINE 80 MCG/ML (10ML) SYRINGE FOR IV PUSH (FOR BLOOD PRESSURE SUPPORT)
PREFILLED_SYRINGE | INTRAVENOUS | Status: DC | PRN
  Administered 2021-09-06 (×3): 80 ug via INTRAVENOUS

## 2021-09-06 MED ORDER — SODIUM CHLORIDE 0.9 % IV SOLN
INTRAVENOUS | Status: AC
  Filled 2021-09-06: qty 50

## 2021-09-06 MED ORDER — GLYCOPYRROLATE 0.2 MG/ML IJ SOLN
INTRAMUSCULAR | Status: DC | PRN
  Administered 2021-09-06: .2 mg via INTRAVENOUS

## 2021-09-06 MED ORDER — PROPOFOL 10 MG/ML IV BOLUS
INTRAVENOUS | Status: AC
  Filled 2021-09-06: qty 20

## 2021-09-06 MED ORDER — GLUCAGON HCL RDNA (DIAGNOSTIC) 1 MG IJ SOLR
INTRAMUSCULAR | Status: DC | PRN
  Administered 2021-09-06: .25 mg via INTRAVENOUS

## 2021-09-06 MED ORDER — FENTANYL CITRATE (PF) 100 MCG/2ML IJ SOLN
INTRAMUSCULAR | Status: AC
  Filled 2021-09-06: qty 2

## 2021-09-06 MED ORDER — SUCCINYLCHOLINE CHLORIDE 200 MG/10ML IV SOSY
PREFILLED_SYRINGE | INTRAVENOUS | Status: DC | PRN
  Administered 2021-09-06: 120 mg via INTRAVENOUS

## 2021-09-06 MED ORDER — PROPOFOL 10 MG/ML IV BOLUS
INTRAVENOUS | Status: DC | PRN
Start: 2021-09-06 — End: 2021-09-06
  Administered 2021-09-06: 120 mg via INTRAVENOUS

## 2021-09-06 MED ORDER — FENTANYL CITRATE PF 50 MCG/ML IJ SOSY: 25.0000 ug | PREFILLED_SYRINGE | INTRAMUSCULAR | Status: DC | PRN

## 2021-09-06 SURGICAL SUPPLY — 27 items
BALLN RETRIEVAL 12X15 (BALLOONS) IMPLANT
BALN RTRVL 200 6-7FR 12-15 (BALLOONS)
BASKET TRAPEZOID 3X6 (MISCELLANEOUS) IMPLANT
BASKET TRAPEZOID LITHO 2.0X5 (MISCELLANEOUS) ×1 IMPLANT
BSKT STON RTRVL TRAPEZOID 2X5 (MISCELLANEOUS)
BSKT STON RTRVL TRAPEZOID 3X6 (MISCELLANEOUS)
DEVICE INFLATION ENCORE 26 (MISCELLANEOUS) IMPLANT
DEVICE LOCKING W-BIOPSY CAP (MISCELLANEOUS) ×1 IMPLANT
GLOVE BIOGEL PI IND STRL 7.0 (GLOVE) ×2 IMPLANT
GLOVE BIOGEL PI INDICATOR 7.0 (GLOVE) ×1
GUIDEWIRE HYDRA JAGWIRE .35 (WIRE) IMPLANT
GUIDEWIRE JAG HINI 025X260CM (WIRE) IMPLANT
KIT ENDO PROCEDURE PEN (KITS) ×1 IMPLANT
KIT TURNOVER KIT A (KITS) ×1 IMPLANT
LUBRICANT JELLY 4.5OZ STERILE (MISCELLANEOUS) IMPLANT
PAD ARMBOARD 7.5X6 YLW CONV (MISCELLANEOUS) ×1 IMPLANT
POSITIONER HEAD 8X9X4 ADT (SOFTGOODS) IMPLANT
SCOPE SPY DS DISPOSABLE (MISCELLANEOUS) ×1 IMPLANT
SNARE ROTATE MED OVAL 20MM (MISCELLANEOUS) IMPLANT
SNARE SHORT THROW 13M SML OVAL (MISCELLANEOUS) IMPLANT
SPHINCTEROTOME AUTOTOME .25 (MISCELLANEOUS) IMPLANT
SPHINCTEROTOME HYDRATOME 44 (MISCELLANEOUS) ×1 IMPLANT
SYSTEM CONTINUOUS INJECTION (MISCELLANEOUS) ×1 IMPLANT
TUBING INSUFFLATOR CO2MPACT (TUBING) ×1 IMPLANT
WALLSTENT METAL COVERED 10X60 (STENTS) IMPLANT
WALLSTENT METAL COVERED 10X80 (STENTS) IMPLANT
WATER STERILE IRR 1000ML POUR (IV SOLUTION) ×1 IMPLANT

## 2021-09-06 NOTE — Op Note (Signed)
[facility] ?Patient Name: Christine Caldwell ?Procedure Date: 09/06/2021 11:35 AM ?MRN: [id number] ?Date of Birth: Jun 25, 1933 ?Attending MD: Hildred Laser , MD ?CSN: [id number] ?Age: 88 ?Admit Type: Outpatient ?Procedure:                ERCP ?Indications:              Common bile duct stone(s), Biliary stent removal ?Providers:                Hildred Laser, MD, Lurline Del, RN, Randa Spike,  ?                          Technician ?Referring MD:             Glenda Chroman, MD ?Medicines:                General Anesthesia ?Complications:            No immediate complications. ?Estimated Blood Loss:     Estimated blood loss: none. Blood in distal  ?                          esophagus and proximal stomach noted to be coming  ?                          from gastric polyps. ?Procedure:                Pre-Anesthesia Assessment: ?                          - Prior to the procedure, a History and Physical  ?                          was performed, and patient medications and  ?                          allergies were reviewed. The patient's tolerance of  ?                          previous anesthesia was also reviewed. The risks  ?                          and benefits of the procedure and the sedation  ?                          options and risks were discussed with the patient.  ?                          All questions were answered, and informed consent  ?                          was obtained. Prior Anticoagulants: The patient has  ?                          taken no previous anticoagulant or antiplatelet  ?  agents except for aspirin. ASA Grade Assessment:  ?                          III - A patient with severe systemic disease. After  ?                          reviewing the risks and benefits, the patient was  ?                          deemed in satisfactory condition to undergo the  ?                          procedure. ?                          After obtaining informed consent, the scope was  ?                           passed under direct vision. Throughout the  ?                          procedure, the patient's blood pressure, pulse, and  ?                          oxygen saturations were monitored continuously. The  ?                          [employer] D single use  ?                          duodenoscope was introduced through the mouth, and  ?                          used to inject contrast into and used to cannulate  ?                          the bile duct. The ERCP was accomplished without  ?                          difficulty. The patient tolerated the procedure  ?                          well. ?Scope In: ?Scope Out: ?Findings: ?     A biliary stent was visible on the scout film. One stent was removed  ?     from the biliary tree using a snare. The stent was found to be patent  ?     via the water column test. The major papilla was normal. The common bile  ?     duct contained filling defect(s). A 6 mm biliary sphincterotomy was made  ?     with a braided Autotome sphincterotome using ERBE electrocautery. There  ?     was no post-sphincterotomy bleeding. The biliary tree was swept with a  ?     12 mm balloon starting at the upper third of  the main bile duct. One  ?     stone was removed. No stones remained. As the duodenal scope was  ?     withdrawn clot was noted in the distal esophagus. Therefore  ?     esophagogastroduodenoscopy was performed. ?     Hypopharyngeal and esophageal mucosa was normal. GE junction was  ?     unremarkable. There was a polyp just distal to the GE junction covered  ?     with blood but there was no active bleeding. There were multiple other  ?     polyps at gastric body along with complex broad-based polyp which had  ?     tubular projection. It was ulcerated. Was biopsied. Endoscope was  ?     withdrawn. ?Impression:               - Biliary stent removed. It was patent. ?                          - The major papilla appeared normal with evidence   ?                          of prior sphincterotomy. ?                          - Dilated CBD and CHD with small filling defect. ?                          - Biliary sphincterotomy extended and stone stone  ?                          was removed with stone balloon extractor along with  ?                          sludge. ?                          - Unplanned esophagogastroduodenoscopy performed  ?                          revealing multiple gastric polyps with stigmata of  ?                          bleed. 1 of these polyps was 4 cm with ulceration.  ?                          Resection not attempted. Biopsy taken. ?Moderate Sedation: ?     Per Anesthesia Care ?Recommendation:           - Discharge patient to home (with escort). ?                          - Clear liquid diet today. ?                          - Continue present medications. ?                          -  Avoid aspirin and nonsteroidal anti-inflammatory  ?                          medicines. ?                          - Await cytology results. ?Procedure Code(s):        --- Professional --- ?                          [phone], Endoscopic retrograde  ?                          cholangiopancreatography (ERCP); with removal of  ?                          foreign body(s) or stent(s) from biliary/pancreatic  ?                          duct(s) ?                          [id number], Endoscopic retrograde  ?                          cholangiopancreatography (ERCP); with removal of  ?                          calculi/debris from biliary/pancreatic duct(s) ?                          [id number], Endoscopic retrograde  ?                          cholangiopancreatography (ERCP); with  ?                          sphincterotomy/papillotomy ?Diagnosis Code(s):        --- Professional --- ?                          K80.50, Calculus of bile duct without cholangitis  ?                          or cholecystitis without obstruction ?                          Z46.59, Encounter for  fitting and adjustment of  ?                          other gastrointestinal appliance and device ?                          K31.7, Polyp of stomach and duodenum ?CPT copyright 2019 American Medical Association. All rights reserved. ?The codes documented in this report are preliminary and upon coder review may  ?be revised to meet current compliance requirements. ?Hildred Laser, MD ?Hildred Laser, MD ?09/06/2021 2:31:12 PM ?This report has been signed electronically. ?Number of Addenda: 0 ?

## 2021-09-06 NOTE — Anesthesia Procedure Notes (Signed)
Procedure Name: Intubation ?Date/Time: 09/06/2021 12:58 PM ?Performed by: Gwyndolyn Saxon, CRNA ?Pre-anesthesia Checklist: Patient identified, Emergency Drugs available, Suction available and Patient being monitored ?Patient Re-evaluated:Patient Re-evaluated prior to induction ?Oxygen Delivery Method: Circle system utilized ?Preoxygenation: Pre-oxygenation with 100% oxygen ?Induction Type: IV induction ?Ventilation: Mask ventilation without difficulty ?Laryngoscope Size: Sabra Heck and 2 ?Grade View: Grade I ?Tube type: Oral ?Tube size: 7.0 mm ?Number of attempts: 1 ?Airway Equipment and Method: Patient positioned with wedge pillow and Stylet ?Placement Confirmation: ETT inserted through vocal cords under direct vision, positive ETCO2 and breath sounds checked- equal and bilateral ?Secured at: 20 cm ?Tube secured with: Tape ?Dental Injury: Teeth and Oropharynx as per pre-operative assessment  ? ? ? ? ?

## 2021-09-06 NOTE — Discharge Instructions (Signed)
Discontinue aspirin. ?Keep Excedrin use to minimum. ?Resume other medications as before. ?Clear liquids today.  Can resume usual diet starting tomorrow morning. ?No driving for 24 hours. ?Physician will call with biopsy results and further recommendations. ?

## 2021-09-06 NOTE — H&P (Signed)
Christine Caldwell is an 88 y.o. female.   ?Chief Complaint: Patient is here for ERCP with stent and stone removal. ?HPI: Patient is 88 year old Caucasian female who presented back in February 2023 with epigastric pain.  On work-up she was found to have choledocholithiasis.  She underwent ERCP with sphincterotomy on 07/05/2021.  She had 3 stones in her bile duct which was dilated.  2 stones were removed.  Third stone could not be removed.  Therefore biliary stent was left in place. ?Patient says she has done fine since her last ERCP.  She has not experienced epigastric pain nausea vomiting fever or chills.  Her appetite is good and she is maintaining her weight.  She also denies chest pain or shortness of breath. ?She is on low-dose aspirin.  Last dose was yesterday. ? ?Past Medical History:  ?Diagnosis Date  ? Coronary artery disease (CAD) excluded   ? Myoview stress test 2012 within normal limits, normal LV function echocardiogram  ? Hyperlipidemia   ? Total cholesterol 190  ? Hypertension   ? Obesity   ? Other and unspecified hyperlipidemia   ? Palpitations   ? Marland Kitchen Longstanding palpitations.  Documented history of PACs and PVCs.  ? PVC's (premature ventricular contractions)   ?  stable  ? Type 2 diabetes mellitus ([place])   ? ? ?Past Surgical History:  ?Procedure Laterality Date  ? BILIARY STENT PLACEMENT N/A 07/05/2021  ? Procedure: biliary stent placement;  Surgeon: Rogene Houston, MD;  Location: [facility];  Service: Endoscopy;  Laterality: N/A;  ? ERCP N/A 07/05/2021  ? Procedure: ENDOSCOPIC RETROGRADE CHOLANGIOPANCREATOGRAPHY (ERCP);  Surgeon: Rogene Houston, MD;  Location: [facility];  Service: Endoscopy;  Laterality: N/A;  ? SPHINCTEROTOMY N/A 07/05/2021  ? Procedure: SPHINCTEROTOMY with stone two extraction;  Surgeon: Rogene Houston, MD;  Location: [facility];  Service: Endoscopy;  Laterality: N/A;  ? TOTAL ABDOMINAL HYSTERECTOMY    ? ? ?Family History  ?Problem Relation Age of Onset  ? Thyroid disease Mother   ? Parkinson's  disease Sister   ? Liver cancer Sister   ? ?Social History:  reports that she quit smoking about 38 years ago. Her smoking use included cigarettes. She has a 3.00 pack-year smoking history. She has never used smokeless tobacco. She reports that she does not drink alcohol and does not use drugs. ? ?Allergies: No Known Allergies ? ?Medications Prior to Admission  ?Medication Sig Dispense Refill  ? aspirin EC 81 MG tablet Take 1 tablet (81 mg total) by mouth daily. 30 tablet 11  ? Aspirin-Acetaminophen-Caffeine (EXCEDRIN MIGRAINE PO) Take 1 tablet by mouth daily as needed (headache).    ? citalopram (CELEXA) 20 MG tablet Take 20 mg by mouth daily.    ? Cyanocobalamin 1000 MCG/ML KIT Inject 1,000 mcg as directed every 30 (thirty) days. Once a month    ? diltiazem (CARDIZEM CD) 240 MG 24 hr capsule Take 1 capsule (240 mg total) by mouth daily. 90 capsule 3  ? ferrous sulfate 325 (65 FE) MG tablet Take 325 mg by mouth 2 (two) times daily with a meal.    ? glipiZIDE (GLUCOTROL) 5 MG tablet Take 5 mg by mouth daily.    ? loperamide (IMODIUM) 2 MG capsule Take by mouth as needed for diarrhea or loose stools.    ? metFORMIN (GLUCOPHAGE) 500 MG tablet Take 500 mg by mouth daily with breakfast.    ? metoprolol succinate (TOPROL-XL) 100 MG 24 hr tablet Take 100 mg by  mouth daily.    ? omeprazole (PRILOSEC) 20 MG capsule Take 20 mg by mouth 2 (two) times daily before a meal.    ? rosuvastatin (CRESTOR) 10 MG tablet Take 10 mg by mouth daily.    ? ? ?Results for orders placed or performed during the hospital encounter of 09/06/21 (from the past 48 hour(s))  ?Glucose, capillary     Status: Abnormal  ? Collection Time: 09/06/21 10:40 AM  ?Result Value Ref Range  ? Glucose-Capillary 188 (H) 70 - 99 mg/dL  ?  Comment: Glucose reference range applies only to samples taken after fasting for at least 8 hours.  ? ?No results found. ? ?Review of Systems ? ?Blood pressure 128/65, pulse (!) 55, temperature 97.7 ?F (36.5 ?C), temperature  source Oral, resp. rate 18, SpO2 99 %. ?Physical Exam ?HENT:  ?   Mouth/Throat:  ?   Mouth: Mucous membranes are moist.  ?   Pharynx: Oropharynx is clear.  ?Eyes:  ?   General: No scleral icterus. ?   Conjunctiva/sclera: Conjunctivae normal.  ?Cardiovascular:  ?   Rate and Rhythm: Normal rate and regular rhythm.  ?   Heart sounds: Normal heart sounds. No murmur heard. ?Pulmonary:  ?   Effort: Pulmonary effort is normal.  ?   Breath sounds: Normal breath sounds.  ?Abdominal:  ?   General: There is no distension.  ?   Palpations: Abdomen is soft. There is no mass.  ?   Tenderness: There is no abdominal tenderness.  ?Musculoskeletal:     ?   General: Swelling present.  ?   Cervical back: Neck supple.  ?   Comments: Both pitting and nonpitting edema.  Pitting edema is mild.  ?Lymphadenopathy:  ?   Cervical: No cervical adenopathy.  ?Skin: ?   General: Skin is warm and dry.  ?Neurological:  ?   Mental Status: She is alert.  ?  ? ?Assessment/Plan ? ?Choledocholithiasis. ?ERCP with stent and stone extraction. ? ? ?Hildred Laser, MD ?09/06/2021, 12:09 PM ? ? ? ?

## 2021-09-06 NOTE — Transfer of Care (Signed)
Immediate Anesthesia Transfer of Care Note ? ?Patient: Christine Caldwell ? ?Procedure(s) Performed: ENDOSCOPIC RETROGRADE CHOLANGIOPANCREATOGRAPHY (ERCP) (Esophagus) ?GASTROINTESTINAL STENT REMOVAL (Esophagus) ?REMOVAL OF ONE STONE AND DEBRIDEMENT (Esophagus) ?SPHINCTEROTOMY (Esophagus) ? ?Patient Location: PACU ? ?Anesthesia Type:General ? ?Level of Consciousness: awake and patient cooperative ? ?Airway & Oxygen Therapy: Patient Spontanous Breathing ? ?Post-op Assessment: Report given to RN and Post -op Vital signs reviewed and stable ? ?Post vital signs: Reviewed and stable ? ?Last Vitals:  ?Vitals Value Taken Time  ?BP 120/67 09/06/21 1354  ?Temp 36.4 ?C 09/06/21 1354  ?Pulse 79 09/06/21 1356  ?Resp 14 09/06/21 1356  ?SpO2 98 % 09/06/21 1356  ?Vitals shown include unvalidated device data. ? ?Last Pain:  ?Vitals:  ? 09/06/21 1058  ?TempSrc: Oral  ?PainSc: 0-No pain  ?   ? ?Patients Stated Pain Goal: 5 (09/06/21 1058) ? ?Complications: No notable events documented. ?

## 2021-09-06 NOTE — Progress Notes (Signed)
Brief ERCP note. ? ?Biliary stent in place.  It appeared to be patent. ?Stent was removed using polypectomy snare under fluoroscopic control. ?As the duodenoscope was advanced again there was some blood in the distal esophagus. ?CBD was easily cannulated with Rx 44 autotome and 035 Hydra Jagwire. ?CBD and CHD were dilated with distal tapering.  Filling defect noted in CBD.Marland Kitchen ?Intrahepatic biliary ducts were normal. ?Biliary sphincterotomy was extended. ?There was a gush of contrast small stone and sludge. ?12 mm stone balloon extractor passed through the bile duct twice with removal of angle stone. ?No stones left behind. ? ?Q-scope was passed via oropharynx without any difficulty into esophagus. ?Mucosa of the esophagus was normal.  GE junction was unremarkable. ?There were multiple polyps at gastric body and fundus.  Polyp just below the GE junction was covered with blood and clot but no active bleeding noted. ?There was a complex polyp at gastric body and greater curvature towards anterior wall.  Polyp was broad-based with tubular fragment.  It was ulcerated.  This polyp was biopsied. ?Endoscope was withdrawn.  Patient tolerated the procedures well. ?

## 2021-09-06 NOTE — Anesthesia Preprocedure Evaluation (Signed)
Anesthesia Evaluation  ?Patient identified by MRN, date of birth, ID band ?Patient awake ? ? ? ?Reviewed: ?Allergy & Precautions, H&P , NPO status , Patient's Chart, lab work & pertinent test results, reviewed documented beta blocker date and time  ? ?Airway ?Mallampati: II ? ?TM Distance: >3 FB ?Neck ROM: full ? ? ? Dental ?no notable dental hx. ? ?  ?Pulmonary ?neg pulmonary ROS, former smoker,  ?  ?Pulmonary exam normal ?breath sounds clear to auscultation ? ? ? ? ? ? Cardiovascular ?Exercise Tolerance: Good ?hypertension, negative cardio ROS ? ? ?Rhythm:regular Rate:Normal ? ? ?  ?Neuro/Psych ?negative neurological ROS ? negative psych ROS  ? GI/Hepatic ?negative GI ROS, Neg liver ROS,   ?Endo/Other  ?negative endocrine ROSdiabetes, Type 2 ? Renal/GU ?negative Renal ROS  ?negative genitourinary ?  ?Musculoskeletal ? ? Abdominal ?  ?Peds ? Hematology ?negative hematology ROS ?(+)   ?Anesthesia Other Findings ? ? Reproductive/Obstetrics ?negative OB ROS ? ?  ? ? ? ? ? ? ? ? ? ? ? ? ? ?  ?  ? ? ? ? ? ? ? ? ?Anesthesia Physical ?Anesthesia Plan ? ?ASA: 2 ? ?Anesthesia Plan: General and General ETT  ? ?Post-op Pain Management:   ? ?Induction:  ? ?PONV Risk Score and Plan: Ondansetron ? ?Airway Management Planned:  ? ?Additional Equipment:  ? ?Intra-op Plan:  ? ?Post-operative Plan:  ? ?Informed Consent: I have reviewed the patients History and Physical, chart, labs and discussed the procedure including the risks, benefits and alternatives for the proposed anesthesia with the patient or authorized representative who has indicated his/her understanding and acceptance.  ? ? ? ?Dental Advisory Given ? ?Plan Discussed with: CRNA ? ?Anesthesia Plan Comments:   ? ? ? ? ? ? ?Anesthesia Quick Evaluation ? ?

## 2021-09-07 ENCOUNTER — Ambulatory Visit (HOSPITAL_COMMUNITY)
Admission: RE | Admit: 2021-09-07 | Discharge: 2021-09-07 | Disposition: A | Discharge status: Home / Self Care | Payer: Medicare Other | Source: Ambulatory Visit | Attending: Orthopedic Surgery | Admitting: Orthopedic Surgery

## 2021-09-07 DIAGNOSIS — M4727 Other spondylosis with radiculopathy, lumbosacral region: Secondary | ICD-10-CM | POA: Diagnosis not present

## 2021-09-07 DIAGNOSIS — M48061 Spinal stenosis, lumbar region without neurogenic claudication: Secondary | ICD-10-CM | POA: Diagnosis not present

## 2021-09-07 DIAGNOSIS — M545 Low back pain, unspecified: Secondary | ICD-10-CM | POA: Diagnosis not present

## 2021-09-07 NOTE — Anesthesia Postprocedure Evaluation (Signed)
Anesthesia Post Note ? ?Patient: Christine Caldwell ? ?Procedure(s) Performed: ENDOSCOPIC RETROGRADE CHOLANGIOPANCREATOGRAPHY (ERCP) (Esophagus) ?GASTROINTESTINAL STENT REMOVAL (Esophagus) ?REMOVAL OF ONE STONE AND DEBRIDEMENT (Esophagus) ?SPHINCTEROTOMY (Esophagus) ?ESOPHAGOGASTRODUODENOSCOPY (EGD) (Esophagus) ? ?Patient location during evaluation: Phase II ?Anesthesia Type: General ?Level of consciousness: awake ?Pain management: pain level controlled ?Vital Signs Assessment: post-procedure vital signs reviewed and stable ?Respiratory status: spontaneous breathing and respiratory function stable ?Cardiovascular status: blood pressure returned to baseline and stable ?Postop Assessment: no headache and no apparent nausea or vomiting ?Anesthetic complications: no ?Comments: Late entry ? ? ?No notable events documented. ? ? ?Last Vitals:  ?Vitals:  ? 09/06/21 1427 09/06/21 1428  ?BP:  119/66  ?Pulse: 69   ?Resp: 14   ?Temp: 36.6 ?C   ?SpO2: 98%   ?  ?Last Pain:  ?Vitals:  ? 09/06/21 1427  ?TempSrc: Oral  ?PainSc: 0-No pain  ? ? ?  ?  ?  ?  ?  ?  ? ?Louann Sjogren ? ? ? ? ?

## 2021-09-08 LAB — SURGICAL PATHOLOGY

## 2021-09-12 ENCOUNTER — Telehealth (INDEPENDENT_AMBULATORY_CARE_PROVIDER_SITE_OTHER): Payer: Self-pay | Admitting: *Deleted

## 2021-09-12 NOTE — Telephone Encounter (Signed)
Daughter Melody Colin calling to get biopsy results.  ?[phone] ?

## 2021-09-13 ENCOUNTER — Encounter (HOSPITAL_COMMUNITY): Payer: Self-pay | Admitting: Internal Medicine

## 2021-09-13 ENCOUNTER — Other Ambulatory Visit (INDEPENDENT_AMBULATORY_CARE_PROVIDER_SITE_OTHER): Payer: Self-pay

## 2021-09-13 DIAGNOSIS — K317 Polyp of stomach and duodenum: Secondary | ICD-10-CM

## 2021-09-13 NOTE — Telephone Encounter (Signed)
Dr. Laural Golden called patient.  ?

## 2021-09-14 ENCOUNTER — Encounter (INDEPENDENT_AMBULATORY_CARE_PROVIDER_SITE_OTHER): Payer: Self-pay

## 2021-09-15 DIAGNOSIS — M5416 Radiculopathy, lumbar region: Secondary | ICD-10-CM | POA: Diagnosis not present

## 2021-10-03 DIAGNOSIS — E119 Type 2 diabetes mellitus without complications: Secondary | ICD-10-CM | POA: Diagnosis not present

## 2021-10-04 DIAGNOSIS — M48061 Spinal stenosis, lumbar region without neurogenic claudication: Secondary | ICD-10-CM | POA: Diagnosis not present

## 2021-10-04 DIAGNOSIS — M5416 Radiculopathy, lumbar region: Secondary | ICD-10-CM | POA: Diagnosis not present

## 2021-10-12 ENCOUNTER — Encounter (HOSPITAL_COMMUNITY): Admission: RE | Admit: 2021-10-12 | Payer: Medicare Other | Source: Ambulatory Visit

## 2021-10-13 DIAGNOSIS — M5416 Radiculopathy, lumbar region: Secondary | ICD-10-CM | POA: Diagnosis not present

## 2021-10-26 DIAGNOSIS — Z85828 Personal history of other malignant neoplasm of skin: Secondary | ICD-10-CM | POA: Diagnosis not present

## 2021-10-26 DIAGNOSIS — L304 Erythema intertrigo: Secondary | ICD-10-CM | POA: Diagnosis not present

## 2021-10-26 DIAGNOSIS — Z08 Encounter for follow-up examination after completed treatment for malignant neoplasm: Secondary | ICD-10-CM | POA: Diagnosis not present

## 2021-10-27 DIAGNOSIS — E1165 Type 2 diabetes mellitus with hyperglycemia: Secondary | ICD-10-CM | POA: Diagnosis not present

## 2021-10-27 DIAGNOSIS — Z0000 Encounter for general adult medical examination without abnormal findings: Secondary | ICD-10-CM | POA: Diagnosis not present

## 2021-10-27 DIAGNOSIS — E538 Deficiency of other specified B group vitamins: Secondary | ICD-10-CM | POA: Diagnosis not present

## 2021-10-27 DIAGNOSIS — E7800 Pure hypercholesterolemia, unspecified: Secondary | ICD-10-CM | POA: Diagnosis not present

## 2021-10-27 DIAGNOSIS — Z299 Encounter for prophylactic measures, unspecified: Secondary | ICD-10-CM | POA: Diagnosis not present

## 2021-10-27 DIAGNOSIS — I10 Essential (primary) hypertension: Secondary | ICD-10-CM | POA: Diagnosis not present

## 2021-10-27 DIAGNOSIS — R5383 Other fatigue: Secondary | ICD-10-CM | POA: Diagnosis not present

## 2021-11-02 DIAGNOSIS — E119 Type 2 diabetes mellitus without complications: Secondary | ICD-10-CM | POA: Diagnosis not present

## 2021-11-13 DIAGNOSIS — M5416 Radiculopathy, lumbar region: Secondary | ICD-10-CM | POA: Diagnosis not present

## 2021-11-17 ENCOUNTER — Encounter (HOSPITAL_COMMUNITY)
Admission: RE | Admit: 2021-11-17 | Discharge: 2021-11-17 | Disposition: A | Discharge status: Home / Self Care | Payer: Medicare Other | Source: Ambulatory Visit | Attending: Gastroenterology | Admitting: Gastroenterology

## 2021-11-17 DIAGNOSIS — R002 Palpitations: Secondary | ICD-10-CM

## 2021-11-17 DIAGNOSIS — E119 Type 2 diabetes mellitus without complications: Secondary | ICD-10-CM

## 2021-11-17 HISTORY — DX: Gastro-esophageal reflux disease without esophagitis: K21.9

## 2021-11-17 HISTORY — DX: Depression, unspecified: F32.A

## 2021-11-20 ENCOUNTER — Encounter (HOSPITAL_COMMUNITY): Payer: Self-pay

## 2021-11-21 ENCOUNTER — Encounter (HOSPITAL_COMMUNITY): Payer: Self-pay | Admitting: Gastroenterology

## 2021-11-21 ENCOUNTER — Ambulatory Visit (HOSPITAL_BASED_OUTPATIENT_CLINIC_OR_DEPARTMENT_OTHER): Payer: Medicare Other | Admitting: Anesthesiology

## 2021-11-21 ENCOUNTER — Ambulatory Visit (HOSPITAL_COMMUNITY)
Admission: RE | Admit: 2021-11-21 | Discharge: 2021-11-21 | Disposition: A | Discharge status: Home / Self Care | Payer: Medicare Other | Attending: Gastroenterology | Admitting: Gastroenterology

## 2021-11-21 ENCOUNTER — Ambulatory Visit (HOSPITAL_COMMUNITY): Payer: Medicare Other | Admitting: Anesthesiology

## 2021-11-21 ENCOUNTER — Encounter (HOSPITAL_COMMUNITY)
Admission: RE | Disposition: A | Discharge status: Home / Self Care | Payer: Self-pay | Source: Home / Self Care | Attending: Gastroenterology

## 2021-11-21 DIAGNOSIS — K3189 Other diseases of stomach and duodenum: Secondary | ICD-10-CM

## 2021-11-21 DIAGNOSIS — K219 Gastro-esophageal reflux disease without esophagitis: Secondary | ICD-10-CM | POA: Insufficient documentation

## 2021-11-21 DIAGNOSIS — K317 Polyp of stomach and duodenum: Secondary | ICD-10-CM

## 2021-11-21 DIAGNOSIS — K2950 Unspecified chronic gastritis without bleeding: Secondary | ICD-10-CM | POA: Insufficient documentation

## 2021-11-21 DIAGNOSIS — K319 Disease of stomach and duodenum, unspecified: Secondary | ICD-10-CM | POA: Diagnosis not present

## 2021-11-21 DIAGNOSIS — E119 Type 2 diabetes mellitus without complications: Secondary | ICD-10-CM | POA: Insufficient documentation

## 2021-11-21 DIAGNOSIS — I2510 Atherosclerotic heart disease of native coronary artery without angina pectoris: Secondary | ICD-10-CM | POA: Diagnosis not present

## 2021-11-21 DIAGNOSIS — K259 Gastric ulcer, unspecified as acute or chronic, without hemorrhage or perforation: Secondary | ICD-10-CM | POA: Diagnosis not present

## 2021-11-21 DIAGNOSIS — E785 Hyperlipidemia, unspecified: Secondary | ICD-10-CM | POA: Insufficient documentation

## 2021-11-21 DIAGNOSIS — I10 Essential (primary) hypertension: Secondary | ICD-10-CM | POA: Diagnosis not present

## 2021-11-21 DIAGNOSIS — K269 Duodenal ulcer, unspecified as acute or chronic, without hemorrhage or perforation: Secondary | ICD-10-CM

## 2021-11-21 DIAGNOSIS — E669 Obesity, unspecified: Secondary | ICD-10-CM | POA: Insufficient documentation

## 2021-11-21 DIAGNOSIS — I493 Ventricular premature depolarization: Secondary | ICD-10-CM | POA: Insufficient documentation

## 2021-11-21 HISTORY — PX: SUBMUCOSAL INJECTION: SHX5543

## 2021-11-21 HISTORY — PX: ESOPHAGOGASTRODUODENOSCOPY (EGD) WITH PROPOFOL: SHX5813

## 2021-11-21 HISTORY — PX: POLYPECTOMY: SHX5525

## 2021-11-21 HISTORY — PX: HEMOSTASIS CLIP PLACEMENT: SHX6857

## 2021-11-21 LAB — GLUCOSE, CAPILLARY: Glucose-Capillary: 182 mg/dL — ABNORMAL HIGH (ref 70–99)

## 2021-11-21 SURGERY — ESOPHAGOGASTRODUODENOSCOPY (EGD) WITH PROPOFOL
Anesthesia: General

## 2021-11-21 MED ORDER — OMEPRAZOLE 40 MG PO CPDR: 40.0000 mg | DELAYED_RELEASE_CAPSULE | Freq: Two times a day (BID) | ORAL | 1 refills | Status: AC

## 2021-11-21 MED ORDER — LIDOCAINE HCL 1 % IJ SOLN
INTRAMUSCULAR | Status: DC | PRN
  Administered 2021-11-21: 50 mg via INTRADERMAL

## 2021-11-21 MED ORDER — PROPOFOL 10 MG/ML IV BOLUS
INTRAVENOUS | Status: DC | PRN
  Administered 2021-11-21: 25 mg via INTRAVENOUS
  Administered 2021-11-21: 75 mg via INTRAVENOUS
  Administered 2021-11-21 (×5): 25 mg via INTRAVENOUS

## 2021-11-21 MED ORDER — SODIUM CHLORIDE (PF) 0.9 % IJ SOLN
PREFILLED_SYRINGE | INTRAMUSCULAR | Status: DC | PRN
  Administered 2021-11-21: 3.5 mL

## 2021-11-21 MED ORDER — LACTATED RINGERS IV SOLN: INTRAVENOUS | Status: DC | PRN

## 2021-11-21 NOTE — Anesthesia Postprocedure Evaluation (Signed)
Anesthesia Post Note  Patient: Christine Caldwell  Procedure(s) Performed: ESOPHAGOGASTRODUODENOSCOPY (EGD) WITH PROPOFOL POLYPECTOMY [place]  Patient location during evaluation: Short Stay Anesthesia Type: General Level of consciousness: awake and alert Pain management: pain level controlled Vital Signs Assessment: post-procedure vital signs reviewed and stable Respiratory status: spontaneous breathing Cardiovascular status: stable Postop Assessment: no apparent nausea or vomiting Anesthetic complications: no   No notable events documented.   Last Vitals:  Vitals:   11/21/21 0931  BP: 135/67  Resp: 16  Temp: 36.6 C  SpO2: 99%    Last Pain:  Vitals:   11/21/21 1016  TempSrc:   PainSc: 0-No pain                 Cebastian Neis

## 2021-11-21 NOTE — H&P (Signed)
Christine Caldwell is an 88 y.o. female.   Chief Complaint: history of gastric polyps HPI:  88 year old female with past medical history of GERD, coronary artery disease, hyperlipidemia, hypertension, obesity, PVCs, choledocholithiasis and diabetes, who came to the hospital for evaluation of gastric polyps.  Patient underwent an ERCP on 09/06/2021 by Dr. Laural Golden and she was found to have some gastric polyps, one which has an ulcerated surface.  She was advised to follow up with a repeat EGD to determine if this needed to be resected. The patient denies having any nausea, vomiting, fever, chills, hematochezia, melena, hematemesis, abdominal distention, abdominal pain, diarrhea, jaundice, pruritus or weight loss.   Past Medical History:  Diagnosis Date   Coronary artery disease (CAD) excluded    Myoview stress test 2012 within normal limits, normal LV function echocardiogram   Depression    GERD (gastroesophageal reflux disease)    Hyperlipidemia    Total cholesterol 190   Hypertension    Obesity    Other and unspecified hyperlipidemia    Palpitations    . Longstanding palpitations.  Documented history of PACs and PVCs.   PVC's (premature ventricular contractions)     stable   Type 2 diabetes mellitus ([place])     Past Surgical History:  Procedure Laterality Date   BILIARY STENT PLACEMENT N/A 07/05/2021   Procedure: biliary stent placement;  Surgeon: Rogene Houston, MD;  Location: [facility];  Service: Endoscopy;  Laterality: N/A;   ERCP N/A 07/05/2021   Procedure: ENDOSCOPIC RETROGRADE CHOLANGIOPANCREATOGRAPHY (ERCP);  Surgeon: Rogene Houston, MD;  Location: [facility];  Service: Endoscopy;  Laterality: N/A;   ERCP N/A 09/06/2021   Procedure: ENDOSCOPIC RETROGRADE CHOLANGIOPANCREATOGRAPHY (ERCP);  Surgeon: Rogene Houston, MD;  Location: [facility];  Service: Endoscopy;  Laterality: N/A;  [id number], pt knows to arrive at 10:30   ESOPHAGOGASTRODUODENOSCOPY N/A 09/06/2021   Procedure: ESOPHAGOGASTRODUODENOSCOPY  (EGD);  Surgeon: Rogene Houston, MD;  Location: [facility];  Service: Endoscopy;  Laterality: N/A;   GASTROINTESTINAL STENT REMOVAL N/A 09/06/2021   Procedure: GASTROINTESTINAL STENT REMOVAL;  Surgeon: Rogene Houston, MD;  Location: [facility];  Service: Endoscopy;  Laterality: N/A;   REMOVAL OF STONES N/A 09/06/2021   Procedure: REMOVAL OF ONE STONE AND DEBRIDEMENT;  Surgeon: Rogene Houston, MD;  Location: [facility];  Service: Endoscopy;  Laterality: N/A;   SPHINCTEROTOMY N/A 07/05/2021   Procedure: SPHINCTEROTOMY with stone two extraction;  Surgeon: Rogene Houston, MD;  Location: [facility];  Service: Endoscopy;  Laterality: N/A;   SPHINCTEROTOMY N/A 09/06/2021   Procedure: SPHINCTEROTOMY;  Surgeon: Rogene Houston, MD;  Location: [facility];  Service: Endoscopy;  Laterality: N/A;   TOTAL ABDOMINAL HYSTERECTOMY      Family History  Problem Relation Age of Onset   Thyroid disease Mother    Parkinson's disease Sister    Liver cancer Sister    Social History:  reports that she quit smoking about 38 years ago. Her smoking use included cigarettes. She has a 3.00 pack-year smoking history. She has never used smokeless tobacco. She reports that she does not drink alcohol and does not use drugs.  Allergies: No Known Allergies  Medications Prior to Admission  Medication Sig Dispense Refill   Aspirin-Acetaminophen-Caffeine (EXCEDRIN MIGRAINE PO) Take 1 tablet by mouth daily as needed (headache).     citalopram (CELEXA) 20 MG tablet Take 20 mg by mouth daily.     diltiazem (CARDIZEM CD) 240 MG 24 hr capsule Take 1 capsule (240 mg total)  by mouth daily. 90 capsule 3   ferrous sulfate 325 (65 FE) MG tablet Take 325 mg by mouth 2 (two) times daily with a meal.     glipiZIDE (GLUCOTROL XL) 5 MG 24 hr tablet Take 5 mg by mouth daily with breakfast.     loperamide (IMODIUM) 2 MG capsule Take 2-4 mg by mouth 4 (four) times daily as needed for diarrhea or loose stools.     metFORMIN (GLUCOPHAGE) 500 MG tablet Take 500 mg  by mouth daily with breakfast.     metoprolol succinate (TOPROL-XL) 100 MG 24 hr tablet Take 100 mg by mouth in the morning and at bedtime.     omeprazole (PRILOSEC) 20 MG capsule Take 20 mg by mouth 2 (two) times daily before a meal.     Polyethyl Glycol-Propyl Glycol (LUBRICANT EYE DROPS) 0.4-0.3 % SOLN Place 1 drop into both eyes 3 (three) times daily as needed (dry/irritated eyes.).     rosuvastatin (CRESTOR) 10 MG tablet Take 10 mg by mouth daily after breakfast.     aspirin EC 81 MG tablet Take 81 mg by mouth in the morning. Swallow whole.     Cyanocobalamin 1000 MCG/ML KIT Inject 1,000 mcg as directed every 30 (thirty) days. Once a month      Results for orders placed or performed during the hospital encounter of 11/21/21 (from the past 48 hour(s))  Glucose, capillary     Status: Abnormal   Collection Time: 11/21/21  9:33 AM  Result Value Ref Range   Glucose-Capillary 182 (H) 70 - 99 mg/dL    Comment: Glucose reference range applies only to samples taken after fasting for at least 8 hours.   No results found.  Review of Systems  All other systems reviewed and are negative.   Blood pressure 135/67, temperature 97.8 F (36.6 C), temperature source Oral, resp. rate 16, height '5\' 2"'  (1.575 m), weight 79.4 kg, SpO2 99 %. Physical Exam  GENERAL: The patient is AO x3, in no acute distress. HEENT: Head is normocephalic and atraumatic. EOMI are intact. Mouth is well hydrated and without lesions. NECK: Supple. No masses LUNGS: Clear to auscultation. No presence of rhonchi/wheezing/rales. Adequate chest expansion HEART: RRR, normal s1 and s2. ABDOMEN: Soft, nontender, no guarding, no peritoneal signs, and nondistended. BS +. No masses. EXTREMITIES: Without any cyanosis, clubbing, rash, lesions or edema. NEUROLOGIC: AOx3, no focal motor deficit. SKIN: no jaundice, no rashes  Assessment/Plan  88 year old female with past medical history of GERD, coronary artery disease, hyperlipidemia,  hypertension, obesity, PVCs, choledocholithiasis and diabetes, who came to the hospital for evaluation of gastric polyps.  We will proceed with an EGD.  Harvel Quale, MD 11/21/2021, 9:37 AM

## 2021-11-21 NOTE — Transfer of Care (Signed)
Immediate Anesthesia Transfer of Care Note  Patient: Christine Caldwell  Procedure(s) Performed: ESOPHAGOGASTRODUODENOSCOPY (EGD) WITH PROPOFOL POLYPECTOMY SUBMUCOSAL INJECTION HEMOSTASIS CLIP PLACEMENT  Patient Location: Short Stay  Anesthesia Type:General  Level of Consciousness: awake  Airway & Oxygen Therapy: Patient Spontanous Breathing  Post-op Assessment: Report given to RN  Post vital signs: Reviewed and stable  Last Vitals:  Vitals Value Taken Time  BP    Temp    Pulse    Resp    SpO2      Last Pain:  Vitals:   11/21/21 1016  TempSrc:   PainSc: 0-No pain      Patients Stated Pain Goal: 7 (57/84/69 6295)  Complications: No notable events documented.

## 2021-11-21 NOTE — Discharge Instructions (Signed)
You are being discharged to home.  Resume your previous diet.  We are waiting for your pathology results.  Your physician has recommended a repeat upper endoscopy in two months for surveillance.  Avoid taking Excedrin or NSAIDs for 2 weeks. Take omeprazole 40 mg twice a day for 2 months.

## 2021-11-21 NOTE — Op Note (Signed)
[facility] Patient Name: Christine Caldwell Procedure Date: 11/21/2021 10:09 AM MRN: [id number] Date of Birth: 30-Sep-1933 Attending MD: Maylon Peppers ,  CSN: [id number] Age: 88 Admit Type: Outpatient Procedure:                Upper GI endoscopy Indications:              Follow-up of gastric polyps Providers:                Maylon Peppers, Lambert Mody, Stanton Kidney Referring MD:              Medicines:                Monitored Anesthesia Care Complications:            No immediate complications. Estimated Blood Loss:     Estimated blood loss: none. Estimated blood loss                            was minimal. Procedure:                Pre-Anesthesia Assessment:                           - Prior to the procedure, a History and Physical                            was performed, and patient medications, allergies                            and sensitivities were reviewed. The patient's                            tolerance of previous anesthesia was reviewed.                           - The risks and benefits of the procedure and the                            sedation options and risks were discussed with the                            patient. All questions were answered and informed                            consent was obtained.                           - ASA Grade Assessment: III - A patient with severe                            systemic disease.                           After obtaining informed consent, the endoscope was  passed under direct vision. Throughout the                            procedure, the patient's blood pressure, pulse, and                            oxygen saturations were monitored continuously. The                            [id number]-H190 ([id number]) scope was introduced through the                            mouth, and advanced to the second part of duodenum.                             The upper GI endoscopy was accomplished without                            difficulty. The patient tolerated the procedure                            well. Scope In: 10:17:47 AM Scope Out: 10:48:17 AM Total Procedure Duration: 0 hours 30 minutes 30 seconds  Findings:      The examined esophagus was normal.      Three medium sessile polyps, one presenting with active bleeding/blood       oozing and stigmata of recent bleeding were found in the gastric body.       The polyp that was actively bleeding was removed with a hot snare. Area       was successfully injected with 3.5 mL of a 1:10,000 solution of       epinephrine for hemostasis. For hemostasis, three hemostatic clips were       successfully placed (one of the clips was an Ultra clip). There was no       bleeding at the end of the procedure.      Patchy mildly erythematous mucosa without bleeding was found in the       gastric antrum. Biopsies were taken with a cold forceps for Helicobacter       pylori testing.      A few localized erosions without bleeding were found in the duodenal       bulb. Impression:               - Normal esophagus.                           - Three gastric polyps. Resected and retrieved.                            Injected. Clips were placed.                           - Erythematous mucosa in the antrum. Biopsied.                           - Duodenal erosions without bleeding. Moderate Sedation:      Per  Anesthesia Care Recommendation:           - Discharge patient to home (ambulatory).                           - Resume previous diet.                           - Await pathology results.                           - Repeat upper endoscopy in 2 months for                            surveillance.                           - Avoid taking Excedrin or NSAIDs for 2 weeks.                           - Take omeprazole 40 mg twice a day for 2 months. Procedure Code(s):        --- Professional ---                            [phone], 31, Esophagogastroduodenoscopy, flexible,                            transoral; with control of bleeding, any method                           43251, Esophagogastroduodenoscopy, flexible,                            transoral; with removal of tumor(s), polyp(s), or                            other lesion(s) by snare technique                           43239, 60, Esophagogastroduodenoscopy, flexible,                            transoral; with biopsy, single or multiple Diagnosis Code(s):        --- Professional ---                           K31.7, Polyp of stomach and duodenum                           K31.89, Other diseases of stomach and duodenum                           K26.9, Duodenal ulcer, unspecified as acute or                            chronic, without hemorrhage or perforation CPT copyright 2019 [employer]. All rights reserved. The  codes documented in this report are preliminary and upon coder review may  be revised to meet current compliance requirements. Maylon Peppers, MD Maylon Peppers,  11/21/2021 11:08:20 AM This report has been signed electronically. Number of Addenda: 0

## 2021-11-21 NOTE — Anesthesia Preprocedure Evaluation (Signed)
Anesthesia Evaluation  Patient identified by MRN, date of birth, ID band Patient awake    Reviewed: Allergy & Precautions, NPO status , Patient's Chart, lab work & pertinent test results, reviewed documented beta blocker date and time   History of Anesthesia Complications Negative for: history of anesthetic complications  Airway Mallampati: II  TM Distance: >3 FB Neck ROM: Full    Dental  (+) Upper Dentures, Lower Dentures   Pulmonary neg pulmonary ROS, former smoker,    Pulmonary exam normal breath sounds clear to auscultation       Cardiovascular hypertension, Pt. on medications and Pt. on home beta blockers + CAD  Normal cardiovascular exam+ dysrhythmias (PVCs)  Rhythm:Regular Rate:Normal     Neuro/Psych PSYCHIATRIC DISORDERS Depression negative neurological ROS  negative psych ROS   GI/Hepatic Neg liver ROS, GERD  Medicated,  Endo/Other  negative endocrine ROSdiabetes, Well Controlled, Type 2, Oral Hypoglycemic Agents  Renal/GU negative Renal ROS  negative genitourinary   Musculoskeletal  (+) Arthritis , Osteoarthritis,    Abdominal   Peds negative pediatric ROS (+)  Hematology negative hematology ROS (+)   Anesthesia Other Findings   Reproductive/Obstetrics negative OB ROS                             Anesthesia Physical  Anesthesia Plan  ASA: 2  Anesthesia Plan: General   Post-op Pain Management: Minimal or no pain anticipated   Induction: Intravenous  PONV Risk Score and Plan: Ondansetron  Airway Management Planned: Nasal Cannula and Natural Airway  Additional Equipment:   Intra-op Plan:   Post-operative Plan:   Informed Consent: I have reviewed the patients History and Physical, chart, labs and discussed the procedure including the risks, benefits and alternatives for the proposed anesthesia with the patient or authorized representative who has indicated his/her  understanding and acceptance.     Dental advisory given  Plan Discussed with: CRNA and Surgeon  Anesthesia Plan Comments:         Anesthesia Quick Evaluation

## 2021-11-22 LAB — SURGICAL PATHOLOGY

## 2021-11-23 DIAGNOSIS — E538 Deficiency of other specified B group vitamins: Secondary | ICD-10-CM | POA: Diagnosis not present

## 2021-11-27 ENCOUNTER — Encounter (HOSPITAL_COMMUNITY): Payer: Self-pay | Admitting: Gastroenterology

## 2021-11-27 DIAGNOSIS — R5383 Other fatigue: Secondary | ICD-10-CM | POA: Diagnosis not present

## 2021-11-27 DIAGNOSIS — E7800 Pure hypercholesterolemia, unspecified: Secondary | ICD-10-CM | POA: Diagnosis not present

## 2021-11-27 DIAGNOSIS — Z79899 Other long term (current) drug therapy: Secondary | ICD-10-CM | POA: Diagnosis not present

## 2021-12-04 DIAGNOSIS — E119 Type 2 diabetes mellitus without complications: Secondary | ICD-10-CM | POA: Diagnosis not present

## 2021-12-06 ENCOUNTER — Encounter (INDEPENDENT_AMBULATORY_CARE_PROVIDER_SITE_OTHER): Payer: Self-pay

## 2021-12-06 ENCOUNTER — Other Ambulatory Visit (INDEPENDENT_AMBULATORY_CARE_PROVIDER_SITE_OTHER): Payer: Self-pay

## 2021-12-06 DIAGNOSIS — K317 Polyp of stomach and duodenum: Secondary | ICD-10-CM

## 2021-12-07 ENCOUNTER — Encounter (INDEPENDENT_AMBULATORY_CARE_PROVIDER_SITE_OTHER): Payer: Self-pay

## 2021-12-18 DIAGNOSIS — E119 Type 2 diabetes mellitus without complications: Secondary | ICD-10-CM | POA: Diagnosis not present

## 2021-12-26 DIAGNOSIS — E538 Deficiency of other specified B group vitamins: Secondary | ICD-10-CM | POA: Diagnosis not present

## 2022-01-03 DIAGNOSIS — E119 Type 2 diabetes mellitus without complications: Secondary | ICD-10-CM | POA: Diagnosis not present

## 2022-01-11 NOTE — Patient Instructions (Signed)
Christine Caldwell  01/11/2022     '@PREFPERIOPPHARMACY'$ @   Your procedure is scheduled on  01/16/2022.   Report to [facility] at  1145  A.M.   Call this number if you have problems the morning of surgery:  [phone]   Remember:  Follow the diet instructions given to you by the office.     Take these medicines the morning of surgery with A SIP OF WATER                                    celexa, cardiazem, toprol, prilosec.     Do not wear jewelry, make-up or nail polish.  Do not wear lotions, powders, or perfumes, or deodorant.  Do not shave 48 hours prior to surgery.  Men may shave face and neck.  Do not bring valuables to the hospital.  [facility] is not responsible for any belongings or valuables.  Contacts, dentures or bridgework may not be worn into surgery.  Leave your suitcase in the car.  After surgery it may be brought to your room.  For patients admitted to the hospital, discharge time will be determined by your treatment team.  Patients discharged the day of surgery will not be allowed to drive home and must have someone with them for 24 hours.    Special instructions:   DO NOT smoke tobacco or vape for 24 hours before your procedure.   Please read over the following fact sheets that you were given. Anesthesia Post-op Instructions and Care and Recovery After Surgery      Upper Endoscopy, Adult, Care After After the procedure, it is common to have a sore throat. It is also common to have: Mild stomach pain or discomfort. Bloating. Nausea. Follow these instructions at home: The instructions below may help you care for yourself at home. Your health care provider may give you more instructions. If you have questions, ask your health care provider. If you were given a sedative during the procedure, it can affect you for several hours. Do not drive or operate machinery until your health care provider says that it is safe. If you will be going home right  after the procedure, plan to have a responsible adult: Take you home from the hospital or clinic. You will not be allowed to drive. Care for you for the time you are told. Follow instructions from your health care provider about what you may eat and drink. Return to your normal activities as told by your health care provider. Ask your health care provider what activities are safe for you. Take over-the-counter and prescription medicines only as told by your health care provider. Contact a health care provider if you: Have a sore throat that lasts longer than one day. Have trouble swallowing. Have a fever. Get help right away if you: Vomit blood or your vomit looks like coffee grounds. Have bloody, black, or tarry stools. Have a very bad sore throat or you cannot swallow. Have difficulty breathing or very bad pain in your chest or abdomen. These symptoms may be an emergency. Get help right away. Call 911. Do not wait to see if the symptoms will go away. Do not drive yourself to the hospital. Summary After the procedure, it is common to have a sore throat, mild stomach discomfort, bloating, and nausea. If you were given a sedative during the procedure, it  can affect you for several hours. Do not drive until your health care provider says that it is safe. Follow instructions from your health care provider about what you may eat and drink. Return to your normal activities as told by your health care provider. This information is not intended to replace advice given to you by your health care provider. Make sure you discuss any questions you have with your health care provider. Document Revised: 08/02/2021 Document Reviewed: 08/02/2021 Elsevier Patient Education  [place] After This sheet gives you information about how to care for yourself after your procedure. Your health care provider may also give you more specific instructions. If you have  problems or questions, contact your health care provider. What can I expect after the procedure? After the procedure, it is common to have: Tiredness. Forgetfulness about what happened after the procedure. Impaired judgment for important decisions. Nausea or vomiting. Some difficulty with balance. Follow these instructions at home: For the time period you were told by your health care provider:     Rest as needed. Do not participate in activities where you could fall or become injured. Do not drive or use machinery. Do not drink alcohol. Do not take sleeping pills or medicines that cause drowsiness. Do not make important decisions or sign legal documents. Do not take care of children on your own. Eating and drinking Follow the diet that is recommended by your health care provider. Drink enough fluid to keep your urine pale yellow. If you vomit: Drink water, juice, or soup when you can drink without vomiting. Make sure you have little or no nausea before eating solid foods. General instructions Have a responsible adult stay with you for the time you are told. It is important to have someone help care for you until you are awake and alert. Take over-the-counter and prescription medicines only as told by your health care provider. If you have sleep apnea, surgery and certain medicines can increase your risk for breathing problems. Follow instructions from your health care provider about wearing your sleep device: Anytime you are sleeping, including during daytime naps. While taking prescription pain medicines, sleeping medicines, or medicines that make you drowsy. Avoid smoking. Keep all follow-up visits as told by your health care provider. This is important. Contact a health care provider if: You keep feeling nauseous or you keep vomiting. You feel light-headed. You are still sleepy or having trouble with balance after 24 hours. You develop a rash. You have a fever. You have  redness or swelling around the IV site. Get help right away if: You have trouble breathing. You have new-onset confusion at home. Summary For several hours after your procedure, you may feel tired. You may also be forgetful and have poor judgment. Have a responsible adult stay with you for the time you are told. It is important to have someone help care for you until you are awake and alert. Rest as told. Do not drive or operate machinery. Do not drink alcohol or take sleeping pills. Get help right away if you have trouble breathing, or if you suddenly become confused. This information is not intended to replace advice given to you by your health care provider. Make sure you discuss any questions you have with your health care provider. Document Revised: 03/28/2021 Document Reviewed: 03/26/2019 Elsevier Patient Education  [place].

## 2022-01-12 ENCOUNTER — Encounter (HOSPITAL_COMMUNITY)
Admission: RE | Admit: 2022-01-12 | Discharge: 2022-01-12 | Disposition: A | Discharge status: Home / Self Care | Payer: Medicare Other | Source: Ambulatory Visit | Attending: Gastroenterology | Admitting: Gastroenterology

## 2022-01-12 ENCOUNTER — Encounter (HOSPITAL_COMMUNITY): Payer: Self-pay

## 2022-01-12 ENCOUNTER — Other Ambulatory Visit (INDEPENDENT_AMBULATORY_CARE_PROVIDER_SITE_OTHER): Payer: Self-pay

## 2022-01-12 VITALS — BP 119/68 | HR 57 | Temp 97.9°F | Resp 18 | Ht 62.0 in | Wt 175.0 lb

## 2022-01-12 DIAGNOSIS — E119 Type 2 diabetes mellitus without complications: Secondary | ICD-10-CM

## 2022-01-12 DIAGNOSIS — R002 Palpitations: Secondary | ICD-10-CM | POA: Diagnosis not present

## 2022-01-12 DIAGNOSIS — D119 Benign neoplasm of major salivary gland, unspecified: Secondary | ICD-10-CM | POA: Diagnosis not present

## 2022-01-12 DIAGNOSIS — D649 Anemia, unspecified: Secondary | ICD-10-CM | POA: Diagnosis not present

## 2022-01-12 DIAGNOSIS — K317 Polyp of stomach and duodenum: Secondary | ICD-10-CM

## 2022-01-12 DIAGNOSIS — Z794 Long term (current) use of insulin: Secondary | ICD-10-CM | POA: Insufficient documentation

## 2022-01-12 LAB — CBC WITH DIFFERENTIAL/PLATELET
Abs Immature Granulocytes: 0.04 10*3/uL (ref 0.00–0.07)
Basophils Absolute: 0.1 10*3/uL (ref 0.0–0.1)
Basophils Relative: 1 %
Eosinophils Absolute: 0.4 10*3/uL (ref 0.0–0.5)
Eosinophils Relative: 4 %
HCT: 35.9 % — ABNORMAL LOW (ref 36.0–46.0)
Hemoglobin: 11.8 g/dL — ABNORMAL LOW (ref 12.0–15.0)
Immature Granulocytes: 0 %
Lymphocytes Relative: 12 %
Lymphs Abs: 1.2 10*3/uL (ref 0.7–4.0)
MCH: 29.6 pg (ref 26.0–34.0)
MCHC: 32.9 g/dL (ref 30.0–36.0)
MCV: 90 fL (ref 80.0–100.0)
Monocytes Absolute: 0.6 10*3/uL (ref 0.1–1.0)
Monocytes Relative: 6 %
Neutro Abs: 8 10*3/uL — ABNORMAL HIGH (ref 1.7–7.7)
Neutrophils Relative %: 77 %
Platelets: 255 10*3/uL (ref 150–400)
RBC: 3.99 MIL/uL (ref 3.87–5.11)
RDW: 13.5 % (ref 11.5–15.5)
WBC: 10.4 10*3/uL (ref 4.0–10.5)
nRBC: 0 % (ref 0.0–0.2)

## 2022-01-12 LAB — BASIC METABOLIC PANEL
Anion gap: 8 (ref 5–15)
BUN: 36 mg/dL — ABNORMAL HIGH (ref 8–23)
CO2: 23 mmol/L (ref 22–32)
Calcium: 8.9 mg/dL (ref 8.9–10.3)
Chloride: 105 mmol/L (ref 98–111)
Creatinine, Ser: 1.71 mg/dL — ABNORMAL HIGH (ref 0.44–1.00)
GFR, Estimated: 29 mL/min — ABNORMAL LOW (ref >60)
Glucose, Bld: 281 mg/dL — ABNORMAL HIGH (ref 70–99)
Potassium: 4 mmol/L (ref 3.5–5.1)
Sodium: 136 mmol/L (ref 135–145)

## 2022-01-15 ENCOUNTER — Encounter (HOSPITAL_COMMUNITY): Payer: Self-pay | Admitting: Anesthesiology

## 2022-01-16 ENCOUNTER — Ambulatory Visit (HOSPITAL_COMMUNITY): Admission: RE | Admit: 2022-01-16 | Payer: Medicare Other | Source: Home / Self Care | Admitting: Gastroenterology

## 2022-01-16 ENCOUNTER — Encounter (HOSPITAL_COMMUNITY): Admission: RE | Payer: Self-pay | Source: Home / Self Care

## 2022-01-16 DIAGNOSIS — K317 Polyp of stomach and duodenum: Secondary | ICD-10-CM

## 2022-01-16 SURGERY — ESOPHAGOGASTRODUODENOSCOPY (EGD) WITH PROPOFOL
Anesthesia: Monitor Anesthesia Care

## 2022-01-16 NOTE — OR Nursing (Signed)
Patient called to cancel procedure states she cant walk this morning. She will call office to reschedule.

## 2022-01-22 DIAGNOSIS — M25561 Pain in right knee: Secondary | ICD-10-CM | POA: Diagnosis not present

## 2022-01-22 DIAGNOSIS — M898X5 Other specified disorders of bone, thigh: Secondary | ICD-10-CM | POA: Diagnosis not present

## 2022-01-22 DIAGNOSIS — M5416 Radiculopathy, lumbar region: Secondary | ICD-10-CM | POA: Diagnosis not present

## 2022-01-26 DIAGNOSIS — E538 Deficiency of other specified B group vitamins: Secondary | ICD-10-CM | POA: Diagnosis not present

## 2022-01-31 ENCOUNTER — Other Ambulatory Visit (INDEPENDENT_AMBULATORY_CARE_PROVIDER_SITE_OTHER): Payer: Self-pay

## 2022-01-31 ENCOUNTER — Encounter (INDEPENDENT_AMBULATORY_CARE_PROVIDER_SITE_OTHER): Payer: Self-pay

## 2022-01-31 DIAGNOSIS — K317 Polyp of stomach and duodenum: Secondary | ICD-10-CM | POA: Insufficient documentation

## 2022-02-01 DIAGNOSIS — Z299 Encounter for prophylactic measures, unspecified: Secondary | ICD-10-CM | POA: Diagnosis not present

## 2022-02-01 DIAGNOSIS — I10 Essential (primary) hypertension: Secondary | ICD-10-CM | POA: Diagnosis not present

## 2022-02-01 DIAGNOSIS — G319 Degenerative disease of nervous system, unspecified: Secondary | ICD-10-CM | POA: Diagnosis not present

## 2022-02-01 DIAGNOSIS — E113292 Type 2 diabetes mellitus with mild nonproliferative diabetic retinopathy without macular edema, left eye: Secondary | ICD-10-CM | POA: Diagnosis not present

## 2022-02-02 DIAGNOSIS — E119 Type 2 diabetes mellitus without complications: Secondary | ICD-10-CM | POA: Diagnosis not present

## 2022-02-16 DIAGNOSIS — M5416 Radiculopathy, lumbar region: Secondary | ICD-10-CM | POA: Diagnosis not present

## 2022-02-22 ENCOUNTER — Ambulatory Visit (INDEPENDENT_AMBULATORY_CARE_PROVIDER_SITE_OTHER): Payer: Medicare Other | Admitting: Gastroenterology

## 2022-02-26 DIAGNOSIS — E538 Deficiency of other specified B group vitamins: Secondary | ICD-10-CM | POA: Diagnosis not present

## 2022-02-26 DIAGNOSIS — Z23 Encounter for immunization: Secondary | ICD-10-CM | POA: Diagnosis not present

## 2022-02-27 ENCOUNTER — Ambulatory Visit: Payer: Medicare Other | Admitting: Gastroenterology

## 2022-03-05 DIAGNOSIS — E119 Type 2 diabetes mellitus without complications: Secondary | ICD-10-CM | POA: Diagnosis not present

## 2022-03-13 DIAGNOSIS — M5416 Radiculopathy, lumbar region: Secondary | ICD-10-CM | POA: Diagnosis not present

## 2022-03-15 DIAGNOSIS — N1831 Chronic kidney disease, stage 3a: Secondary | ICD-10-CM | POA: Diagnosis not present

## 2022-03-15 DIAGNOSIS — J069 Acute upper respiratory infection, unspecified: Secondary | ICD-10-CM | POA: Diagnosis not present

## 2022-03-15 DIAGNOSIS — I700 Atherosclerosis of aorta: Secondary | ICD-10-CM | POA: Diagnosis not present

## 2022-03-15 DIAGNOSIS — Z299 Encounter for prophylactic measures, unspecified: Secondary | ICD-10-CM | POA: Diagnosis not present

## 2022-03-21 NOTE — Patient Instructions (Signed)
Christine Caldwell  03/21/2022     '@PREFPERIOPPHARMACY'$ @   Your procedure is scheduled on 03/27/22.  Report to [facility] at 1000 A.M.  Call this number if you have problems the morning of surgery:  [phone]  If you experience any cold or flu symptoms such as cough, fever, chills, shortness of breath, etc. between now and your scheduled surgery, please notify us at the above number.   Remember:  Do not eat or drink after midnight.      Take these medicines the morning of surgery with A SIP OF WATER celexa, cardizem, metoprolol & prilosec    Do not wear jewelry, make-up or nail polish.  Do not wear lotions, powders, or perfumes, or deodorant.  Do not shave 48 hours prior to surgery.  Men may shave face and neck.  Do not bring valuables to the hospital.  [facility] is not responsible for any belongings or valuables.  Contacts, dentures or bridgework may not be worn into surgery.  Leave your suitcase in the car.  After surgery it may be brought to your room.  For patients admitted to the hospital, discharge time will be determined by your treatment team.  Patients discharged the day of surgery will not be allowed to drive home.   Name and phone number of your driver:   family Special instructions:  follow diet provided to you from doctor's office  Please read over the following fact sheets that you were given. Anesthesia Post-op Instructions and Care and Recovery After Surgery      Monitored Anesthesia Care, Care After The following information offers guidance on how to care for yourself after your procedure. Your health care provider may also give you more specific instructions. If you have problems or questions, contact your health care provider. What can I expect after the procedure? After the procedure, it is common to have: Tiredness. Little or no memory about what happened during or after the procedure. Impaired judgment when it comes to making decisions. Nausea or  vomiting. Some trouble with balance. Follow these instructions at home: For the time period you were told by your health care provider:  Rest. Do not participate in activities where you could fall or become injured. Do not drive or use machinery. Do not drink alcohol. Do not take sleeping pills or medicines that cause drowsiness. Do not make important decisions or sign legal documents. Do not take care of children on your own. Medicines Take over-the-counter and prescription medicines only as told by your health care provider. If you were prescribed antibiotics, take them as told by your health care provider. Do not stop using the antibiotic even if you start to feel better. Eating and drinking Follow instructions from your health care provider about what you may eat and drink. Drink enough fluid to keep your urine pale yellow. If you vomit: Drink clear fluids slowly and in small amounts as you are able. Clear fluids include water, ice chips, low-calorie sports drinks, and fruit juice that has water added to it (diluted fruit juice). Eat light and bland foods in small amounts as you are able. These foods include bananas, applesauce, rice, lean meats, toast, and crackers. General instructions  Have a responsible adult stay with you for the time you are told. It is important to have someone help care for you until you are awake and alert. If you have sleep apnea, surgery and some medicines can increase your risk for breathing problems. Follow instructions from  your health care provider about wearing your sleep device: When you are sleeping. This includes during daytime naps. While taking prescription pain medicines, sleeping medicines, or medicines that make you drowsy. Do not use any products that contain nicotine or tobacco. These products include cigarettes, chewing tobacco, and vaping devices, such as e-cigarettes. If you need help quitting, ask your health care provider. Contact a  health care provider if: You feel nauseous or vomit every time you eat or drink. You feel light-headed. You are still sleepy or having trouble with balance after 24 hours. You get a rash. You have a fever. You have redness or swelling around the IV site. Get help right away if: You have trouble breathing. You have new confusion after you get home. These symptoms may be an emergency. Get help right away. Call 911. Do not wait to see if the symptoms will go away. Do not drive yourself to the hospital. This information is not intended to replace advice given to you by your health care provider. Make sure you discuss any questions you have with your health care provider. Document Revised: 09/18/2021 Document Reviewed: 09/18/2021 Elsevier Patient Education  [place] Endoscopy, Adult Upper endoscopy is a procedure to look inside the upper GI (gastrointestinal) tract. The upper GI tract is made up of: The esophagus. This is the part of the body that moves food from your mouth to your stomach. The stomach. The duodenum. This is the first part of your small intestine. This procedure is also called esophagogastroduodenoscopy (EGD) or gastroscopy. In this procedure, your health care provider passes a thin, flexible tube (endoscope) through your mouth and down your esophagus into your stomach and into your duodenum. A small camera is attached to the end of the tube. Images from the camera appear on a monitor in the exam room. During this procedure, your health care provider may also remove a small piece of tissue to be sent to a lab and examined under a microscope (biopsy). Your health care provider may do an upper endoscopy to diagnose cancers of the upper GI tract. You may also have this procedure to find the cause of other conditions, such as: Stomach pain. Heartburn. Pain or problems when swallowing. Nausea and vomiting. Stomach bleeding. Stomach ulcers. Tell a health care  provider about: Any allergies you have. All medicines you are taking, including vitamins, herbs, eye drops, creams, and over-the-counter medicines. Any problems you or family members have had with anesthetic medicines. Any bleeding problems you have. Any surgeries you have had. Any medical conditions you have. Whether you are pregnant or may be pregnant. What are the risks? Your healthcare provider will talk with you about risks. These may include: Infection. Bleeding. Allergic reactions to medicines. A tear or hole (perforation) in the esophagus, stomach, or duodenum. What happens before the procedure? When to stop eating and drinking Follow instructions from your health care provider about what you may eat and drink. These may include: 8 hours before your procedure Stop eating most foods. Do not eat meat, fried foods, or fatty foods. Eat only light foods, such as toast or crackers. All liquids are okay except energy drinks and alcohol. 6 hours before your procedure Stop eating. Drink only clear liquids, such as water, clear fruit juice, black coffee, plain tea, and sports drinks. Do not drink energy drinks or alcohol. 2 hours before your procedure Stop drinking all liquids. You may be allowed to take medicines with small sips of water. If you  do not follow your health care provider's instructions, your procedure may be delayed or canceled. Medicines Ask your health care provider about: Changing or stopping your regular medicines. This is especially important if you are taking diabetes medicines or blood thinners. Taking medicines such as aspirin and ibuprofen. These medicines can thin your blood. Do not take these medicines unless your health care provider tells you to take them. Taking over-the-counter medicines, vitamins, herbs, and supplements. General instructions If you will be going home right after the procedure, plan to have a responsible adult: Take you home from the  hospital or clinic. You will not be allowed to drive. Care for you for the time you are told. What happens during the procedure?  An IV will be inserted into one of your veins. You may be given one or more of the following: A medicine to help you relax (sedative). A medicine to numb the throat (local anesthetic). You will lie on your left side on an exam table. Your health care provider will pass the endoscope through your mouth and down your esophagus. Your health care provider will use the scope to check the inside of your esophagus, stomach, and duodenum. Biopsies may be taken. The endoscope will be removed. The procedure may vary among health care providers and hospitals. What happens after the procedure? Your blood pressure, heart rate, breathing rate, and blood oxygen level will be monitored until you leave the hospital or clinic. When your throat is no longer numb, you may be given some fluids to drink. If you were given a sedative during the procedure, it can affect you for several hours. Do not drive or operate machinery until your health care provider says that it is safe. It is up to you to get the results of your procedure. Ask your health care provider, or the department that is doing the procedure, when your results will be ready. Contact a health care provider if you: Have a sore throat that lasts longer than 1 day. Have a fever. Get help right away if you: Vomit blood or your vomit looks like coffee grounds. Have bloody, black, or tarry stools. Have a very bad sore throat or you cannot swallow. Have difficulty breathing or very bad pain in your chest or abdomen. These symptoms may be an emergency. Get help right away. Call 911. Do not wait to see if the symptoms will go away. Do not drive yourself to the hospital. Summary Upper endoscopy is a procedure to look inside the upper GI tract. During the procedure, an IV will be inserted into one of your veins. You may be  given a medicine to help you relax. The endoscope will be passed through your mouth and down your esophagus. Follow instructions from your health care provider about what you can eat and drink. This information is not intended to replace advice given to you by your health care provider. Make sure you discuss any questions you have with your health care provider. Document Revised: 08/02/2021 Document Reviewed: 08/02/2021 Elsevier Patient Education  [place] Endoscopy, Adult, Care After After the procedure, it is common to have a sore throat. It is also common to have: Mild stomach pain or discomfort. Bloating. Nausea. Follow these instructions at home: The instructions below may help you care for yourself at home. Your health care provider may give you more instructions. If you have questions, ask your health care provider. If you were given a sedative during the procedure, it can affect  you for several hours. Do not drive or operate machinery until your health care provider says that it is safe. If you will be going home right after the procedure, plan to have a responsible adult: Take you home from the hospital or clinic. You will not be allowed to drive. Care for you for the time you are told. Follow instructions from your health care provider about what you may eat and drink. Return to your normal activities as told by your health care provider. Ask your health care provider what activities are safe for you. Take over-the-counter and prescription medicines only as told by your health care provider. Contact a health care provider if you: Have a sore throat that lasts longer than one day. Have trouble swallowing. Have a fever. Get help right away if you: Vomit blood or your vomit looks like coffee grounds. Have bloody, black, or tarry stools. Have a very bad sore throat or you cannot swallow. Have difficulty breathing or very bad pain in your chest or abdomen. These  symptoms may be an emergency. Get help right away. Call 911. Do not wait to see if the symptoms will go away. Do not drive yourself to the hospital. Summary After the procedure, it is common to have a sore throat, mild stomach discomfort, bloating, and nausea. If you were given a sedative during the procedure, it can affect you for several hours. Do not drive until your health care provider says that it is safe. Follow instructions from your health care provider about what you may eat and drink. Return to your normal activities as told by your health care provider. This information is not intended to replace advice given to you by your health care provider. Make sure you discuss any questions you have with your health care provider. Document Revised: 08/02/2021 Document Reviewed: 08/02/2021 Elsevier Patient Education  [place].

## 2022-03-23 ENCOUNTER — Encounter (HOSPITAL_COMMUNITY)
Admission: RE | Admit: 2022-03-23 | Discharge: 2022-03-23 | Disposition: A | Discharge status: Home / Self Care | Payer: Medicare Other | Source: Ambulatory Visit | Attending: Gastroenterology | Admitting: Gastroenterology

## 2022-03-23 ENCOUNTER — Encounter (HOSPITAL_COMMUNITY): Payer: Self-pay

## 2022-03-23 HISTORY — DX: Cardiac arrhythmia, unspecified: I49.9

## 2022-03-26 ENCOUNTER — Telehealth (INDEPENDENT_AMBULATORY_CARE_PROVIDER_SITE_OTHER): Payer: Self-pay | Admitting: *Deleted

## 2022-03-26 NOTE — Telephone Encounter (Signed)
Received call from daughter to cancel procedure for tomorrow. Pt has not been feeling well for a few weeks and has had several falls last week. Pt has an appt to see PCP today. Daughter will call back to reschedule once they feel patient is able to have procedure done. FYI Dr. Jenetta Downer

## 2022-03-26 NOTE — Telephone Encounter (Signed)
Thanks, please let the endo team know about this cancellation

## 2022-03-27 ENCOUNTER — Ambulatory Visit (HOSPITAL_COMMUNITY): Admission: RE | Admit: 2022-03-27 | Payer: Medicare Other | Source: Ambulatory Visit | Admitting: Gastroenterology

## 2022-03-27 ENCOUNTER — Encounter (HOSPITAL_COMMUNITY): Admission: RE | Payer: Self-pay | Source: Ambulatory Visit

## 2022-03-27 DIAGNOSIS — N1831 Chronic kidney disease, stage 3a: Secondary | ICD-10-CM | POA: Diagnosis not present

## 2022-03-27 DIAGNOSIS — I10 Essential (primary) hypertension: Secondary | ICD-10-CM | POA: Diagnosis not present

## 2022-03-27 DIAGNOSIS — E1165 Type 2 diabetes mellitus with hyperglycemia: Secondary | ICD-10-CM | POA: Diagnosis not present

## 2022-03-27 DIAGNOSIS — R197 Diarrhea, unspecified: Secondary | ICD-10-CM | POA: Diagnosis not present

## 2022-03-27 DIAGNOSIS — Z299 Encounter for prophylactic measures, unspecified: Secondary | ICD-10-CM | POA: Diagnosis not present

## 2022-03-27 SURGERY — ESOPHAGOGASTRODUODENOSCOPY (EGD) WITH PROPOFOL
Anesthesia: Monitor Anesthesia Care

## 2022-04-02 DIAGNOSIS — E538 Deficiency of other specified B group vitamins: Secondary | ICD-10-CM | POA: Diagnosis not present

## 2022-04-04 DIAGNOSIS — E119 Type 2 diabetes mellitus without complications: Secondary | ICD-10-CM | POA: Diagnosis not present

## 2022-04-06 DIAGNOSIS — M5416 Radiculopathy, lumbar region: Secondary | ICD-10-CM | POA: Diagnosis not present

## 2022-04-07 DIAGNOSIS — Z7984 Long term (current) use of oral hypoglycemic drugs: Secondary | ICD-10-CM | POA: Diagnosis not present

## 2022-04-07 DIAGNOSIS — E1165 Type 2 diabetes mellitus with hyperglycemia: Secondary | ICD-10-CM | POA: Diagnosis not present

## 2022-04-07 DIAGNOSIS — I10 Essential (primary) hypertension: Secondary | ICD-10-CM | POA: Diagnosis not present

## 2022-04-10 DIAGNOSIS — Z299 Encounter for prophylactic measures, unspecified: Secondary | ICD-10-CM | POA: Diagnosis not present

## 2022-04-10 DIAGNOSIS — E1165 Type 2 diabetes mellitus with hyperglycemia: Secondary | ICD-10-CM | POA: Diagnosis not present

## 2022-04-10 DIAGNOSIS — I10 Essential (primary) hypertension: Secondary | ICD-10-CM | POA: Diagnosis not present

## 2022-04-27 ENCOUNTER — Encounter: Payer: Self-pay | Admitting: *Deleted

## 2022-04-27 NOTE — Telephone Encounter (Signed)
Pt has been rescheduled for 06/19/22 at 11 am. New instructions have been mailed.

## 2022-05-03 ENCOUNTER — Encounter: Payer: Self-pay | Admitting: *Deleted

## 2022-05-04 DIAGNOSIS — E119 Type 2 diabetes mellitus without complications: Secondary | ICD-10-CM | POA: Diagnosis not present

## 2022-05-04 DIAGNOSIS — E538 Deficiency of other specified B group vitamins: Secondary | ICD-10-CM | POA: Diagnosis not present

## 2022-05-08 DIAGNOSIS — M5416 Radiculopathy, lumbar region: Secondary | ICD-10-CM | POA: Diagnosis not present

## 2022-05-09 NOTE — Telephone Encounter (Signed)
PA done via [facility]. Auth# [id number], DOS: Jun 19, 2022 - Sep 17, 2022

## 2022-05-10 DIAGNOSIS — X32XXXD Exposure to sunlight, subsequent encounter: Secondary | ICD-10-CM | POA: Diagnosis not present

## 2022-05-10 DIAGNOSIS — Z85828 Personal history of other malignant neoplasm of skin: Secondary | ICD-10-CM | POA: Diagnosis not present

## 2022-05-10 DIAGNOSIS — L570 Actinic keratosis: Secondary | ICD-10-CM | POA: Diagnosis not present

## 2022-05-10 DIAGNOSIS — Z08 Encounter for follow-up examination after completed treatment for malignant neoplasm: Secondary | ICD-10-CM | POA: Diagnosis not present

## 2022-05-10 DIAGNOSIS — C4441 Basal cell carcinoma of skin of scalp and neck: Secondary | ICD-10-CM | POA: Diagnosis not present

## 2022-05-23 DIAGNOSIS — M545 Low back pain, unspecified: Secondary | ICD-10-CM | POA: Diagnosis not present

## 2022-05-28 DIAGNOSIS — M545 Low back pain, unspecified: Secondary | ICD-10-CM | POA: Diagnosis not present

## 2022-05-30 DIAGNOSIS — M545 Low back pain, unspecified: Secondary | ICD-10-CM | POA: Diagnosis not present

## 2022-06-04 DIAGNOSIS — M545 Low back pain, unspecified: Secondary | ICD-10-CM | POA: Diagnosis not present

## 2022-06-04 DIAGNOSIS — E119 Type 2 diabetes mellitus without complications: Secondary | ICD-10-CM | POA: Diagnosis not present

## 2022-06-06 DIAGNOSIS — M545 Low back pain, unspecified: Secondary | ICD-10-CM | POA: Diagnosis not present

## 2022-06-08 DIAGNOSIS — E538 Deficiency of other specified B group vitamins: Secondary | ICD-10-CM | POA: Diagnosis not present

## 2022-06-11 DIAGNOSIS — M545 Low back pain, unspecified: Secondary | ICD-10-CM | POA: Diagnosis not present

## 2022-06-13 DIAGNOSIS — M545 Low back pain, unspecified: Secondary | ICD-10-CM | POA: Diagnosis not present

## 2022-06-14 NOTE — Patient Instructions (Addendum)
Christine Caldwell  06/14/2022     '@PREFPERIOPPHARMACY'$ @   Your procedure is scheduled on  06/19/2022.   Report to [facility] at  0900  A.M.   Call this number if you have problems the morning of surgery:  [phone]  If you experience any cold or flu symptoms such as cough, fever, chills, shortness of breath, etc. between now and your scheduled surgery, please notify us at the above number.   Remember:  Follow the diet instructions given to you by the office.     Take these medicines the morning of surgery with A SIP OF WATER          celexa, cardiazem, metoprolol, omeprazole.     Do not wear jewelry, make-up or nail polish.  Do not wear lotions, powders, or perfumes, or deodorant.  Do not shave 48 hours prior to surgery.  Men may shave face and neck.  Do not bring valuables to the hospital.  [facility] is not responsible for any belongings or valuables.  Contacts, dentures or bridgework may not be worn into surgery.  Leave your suitcase in the car.  After surgery it may be brought to your room.  For patients admitted to the hospital, discharge time will be determined by your treatment team.  Patients discharged the day of surgery will not be allowed to drive home and must have someone with them for 24 hours.    Special instructions:   DO NOT smoke tobacco or vape for 24 hours before your procedure.  Please read over the following fact sheets that you were given. Anesthesia Post-op Instructions and Care and Recovery After Surgery       Upper Endoscopy, Adult, Care After After the procedure, it is common to have a sore throat. It is also common to have: Mild stomach pain or discomfort. Bloating. Nausea. Follow these instructions at home: The instructions below may help you care for yourself at home. Your health care provider may give you more instructions. If you have questions, ask your health care provider. If you were given a sedative during the procedure,  it can affect you for several hours. Do not drive or operate machinery until your health care provider says that it is safe. If you will be going home right after the procedure, plan to have a responsible adult: Take you home from the hospital or clinic. You will not be allowed to drive. Care for you for the time you are told. Follow instructions from your health care provider about what you may eat and drink. Return to your normal activities as told by your health care provider. Ask your health care provider what activities are safe for you. Take over-the-counter and prescription medicines only as told by your health care provider. Contact a health care provider if you: Have a sore throat that lasts longer than one day. Have trouble swallowing. Have a fever. Get help right away if you: Vomit blood or your vomit looks like coffee grounds. Have bloody, black, or tarry stools. Have a very bad sore throat or you cannot swallow. Have difficulty breathing or very bad pain in your chest or abdomen. These symptoms may be an emergency. Get help right away. Call 911. Do not wait to see if the symptoms will go away. Do not drive yourself to the hospital. Summary After the procedure, it is common to have a sore throat, mild stomach discomfort, bloating, and nausea. If you were given a  sedative during the procedure, it can affect you for several hours. Do not drive until your health care provider says that it is safe. Follow instructions from your health care provider about what you may eat and drink. Return to your normal activities as told by your health care provider. This information is not intended to replace advice given to you by your health care provider. Make sure you discuss any questions you have with your health care provider. Document Revised: 08/02/2021 Document Reviewed: 08/02/2021 Elsevier Patient Education  [place] After The following  information offers guidance on how to care for yourself after your procedure. Your health care provider may also give you more specific instructions. If you have problems or questions, contact your health care provider. What can I expect after the procedure? After the procedure, it is common to have: Tiredness. Little or no memory about what happened during or after the procedure. Impaired judgment when it comes to making decisions. Nausea or vomiting. Some trouble with balance. Follow these instructions at home: For the time period you were told by your health care provider:  Rest. Do not participate in activities where you could fall or become injured. Do not drive or use machinery. Do not drink alcohol. Do not take sleeping pills or medicines that cause drowsiness. Do not make important decisions or sign legal documents. Do not take care of children on your own. Medicines Take over-the-counter and prescription medicines only as told by your health care provider. If you were prescribed antibiotics, take them as told by your health care provider. Do not stop using the antibiotic even if you start to feel better. Eating and drinking Follow instructions from your health care provider about what you may eat and drink. Drink enough fluid to keep your urine pale yellow. If you vomit: Drink clear fluids slowly and in small amounts as you are able. Clear fluids include water, ice chips, low-calorie sports drinks, and fruit juice that has water added to it (diluted fruit juice). Eat light and bland foods in small amounts as you are able. These foods include bananas, applesauce, rice, lean meats, toast, and crackers. General instructions  Have a responsible adult stay with you for the time you are told. It is important to have someone help care for you until you are awake and alert. If you have sleep apnea, surgery and some medicines can increase your risk for breathing problems. Follow  instructions from your health care provider about wearing your sleep device: When you are sleeping. This includes during daytime naps. While taking prescription pain medicines, sleeping medicines, or medicines that make you drowsy. Do not use any products that contain nicotine or tobacco. These products include cigarettes, chewing tobacco, and vaping devices, such as e-cigarettes. If you need help quitting, ask your health care provider. Contact a health care provider if: You feel nauseous or vomit every time you eat or drink. You feel light-headed. You are still sleepy or having trouble with balance after 24 hours. You get a rash. You have a fever. You have redness or swelling around the IV site. Get help right away if: You have trouble breathing. You have new confusion after you get home. These symptoms may be an emergency. Get help right away. Call 911. Do not wait to see if the symptoms will go away. Do not drive yourself to the hospital. This information is not intended to replace advice given to you by your health care provider. Make  sure you discuss any questions you have with your health care provider. Document Revised: 09/18/2021 Document Reviewed: 09/18/2021 Elsevier Patient Education  [place].

## 2022-06-15 ENCOUNTER — Encounter (HOSPITAL_COMMUNITY)
Admission: RE | Admit: 2022-06-15 | Discharge: 2022-06-15 | Disposition: A | Discharge status: Home / Self Care | Payer: Medicare Other | Source: Ambulatory Visit | Attending: Gastroenterology | Admitting: Gastroenterology

## 2022-06-15 ENCOUNTER — Encounter (HOSPITAL_COMMUNITY): Payer: Self-pay

## 2022-06-15 VITALS — BP 114/60 | HR 67 | Temp 97.8°F | Resp 18 | Ht 62.0 in | Wt 170.0 lb

## 2022-06-15 DIAGNOSIS — E119 Type 2 diabetes mellitus without complications: Secondary | ICD-10-CM | POA: Insufficient documentation

## 2022-06-15 DIAGNOSIS — Z01812 Encounter for preprocedural laboratory examination: Secondary | ICD-10-CM | POA: Insufficient documentation

## 2022-06-15 DIAGNOSIS — D649 Anemia, unspecified: Secondary | ICD-10-CM | POA: Insufficient documentation

## 2022-06-15 LAB — BASIC METABOLIC PANEL
Anion gap: 9 (ref 5–15)
BUN: 34 mg/dL — ABNORMAL HIGH (ref 8–23)
CO2: 23 mmol/L (ref 22–32)
Calcium: 8.6 mg/dL — ABNORMAL LOW (ref 8.9–10.3)
Chloride: 105 mmol/L (ref 98–111)
Creatinine, Ser: 1.53 mg/dL — ABNORMAL HIGH (ref 0.44–1.00)
GFR, Estimated: 33 mL/min — ABNORMAL LOW (ref >60)
Glucose, Bld: 272 mg/dL — ABNORMAL HIGH (ref 70–99)
Potassium: 3.9 mmol/L (ref 3.5–5.1)
Sodium: 137 mmol/L (ref 135–145)

## 2022-06-15 LAB — CBC WITH DIFFERENTIAL/PLATELET
Abs Immature Granulocytes: 0.02 10*3/uL (ref 0.00–0.07)
Basophils Absolute: 0.1 10*3/uL (ref 0.0–0.1)
Basophils Relative: 1 %
Eosinophils Absolute: 0.2 10*3/uL (ref 0.0–0.5)
Eosinophils Relative: 3 %
HCT: 35.4 % — ABNORMAL LOW (ref 36.0–46.0)
Hemoglobin: 11.3 g/dL — ABNORMAL LOW (ref 12.0–15.0)
Immature Granulocytes: 0 %
Lymphocytes Relative: 13 %
Lymphs Abs: 1.2 10*3/uL (ref 0.7–4.0)
MCH: 30 pg (ref 26.0–34.0)
MCHC: 31.9 g/dL (ref 30.0–36.0)
MCV: 93.9 fL (ref 80.0–100.0)
Monocytes Absolute: 0.5 10*3/uL (ref 0.1–1.0)
Monocytes Relative: 6 %
Neutro Abs: 7.1 10*3/uL (ref 1.7–7.7)
Neutrophils Relative %: 77 %
Platelets: 227 10*3/uL (ref 150–400)
RBC: 3.77 MIL/uL — ABNORMAL LOW (ref 3.87–5.11)
RDW: 13.9 % (ref 11.5–15.5)
WBC: 9.1 10*3/uL (ref 4.0–10.5)
nRBC: 0 % (ref 0.0–0.2)

## 2022-06-18 DIAGNOSIS — C4441 Basal cell carcinoma of skin of scalp and neck: Secondary | ICD-10-CM | POA: Diagnosis not present

## 2022-06-19 ENCOUNTER — Ambulatory Visit (HOSPITAL_COMMUNITY)
Admission: RE | Admit: 2022-06-19 | Discharge: 2022-06-19 | Disposition: A | Discharge status: Home / Self Care | Payer: Medicare Other | Source: Ambulatory Visit | Attending: Gastroenterology | Admitting: Gastroenterology

## 2022-06-19 ENCOUNTER — Ambulatory Visit (HOSPITAL_COMMUNITY): Payer: Medicare Other | Admitting: Anesthesiology

## 2022-06-19 ENCOUNTER — Encounter (HOSPITAL_COMMUNITY)
Admission: RE | Disposition: A | Discharge status: Home / Self Care | Payer: Self-pay | Source: Ambulatory Visit | Attending: Gastroenterology

## 2022-06-19 ENCOUNTER — Ambulatory Visit (HOSPITAL_BASED_OUTPATIENT_CLINIC_OR_DEPARTMENT_OTHER): Payer: Medicare Other | Admitting: Anesthesiology

## 2022-06-19 ENCOUNTER — Encounter (HOSPITAL_COMMUNITY): Payer: Self-pay | Admitting: Gastroenterology

## 2022-06-19 DIAGNOSIS — K219 Gastro-esophageal reflux disease without esophagitis: Secondary | ICD-10-CM | POA: Insufficient documentation

## 2022-06-19 DIAGNOSIS — K259 Gastric ulcer, unspecified as acute or chronic, without hemorrhage or perforation: Secondary | ICD-10-CM | POA: Diagnosis not present

## 2022-06-19 DIAGNOSIS — I10 Essential (primary) hypertension: Secondary | ICD-10-CM | POA: Insufficient documentation

## 2022-06-19 DIAGNOSIS — E785 Hyperlipidemia, unspecified: Secondary | ICD-10-CM | POA: Insufficient documentation

## 2022-06-19 DIAGNOSIS — E119 Type 2 diabetes mellitus without complications: Secondary | ICD-10-CM | POA: Insufficient documentation

## 2022-06-19 DIAGNOSIS — E669 Obesity, unspecified: Secondary | ICD-10-CM | POA: Insufficient documentation

## 2022-06-19 DIAGNOSIS — Z87891 Personal history of nicotine dependence: Secondary | ICD-10-CM | POA: Insufficient documentation

## 2022-06-19 DIAGNOSIS — I2510 Atherosclerotic heart disease of native coronary artery without angina pectoris: Secondary | ICD-10-CM | POA: Diagnosis not present

## 2022-06-19 DIAGNOSIS — K317 Polyp of stomach and duodenum: Secondary | ICD-10-CM

## 2022-06-19 DIAGNOSIS — F32A Depression, unspecified: Secondary | ICD-10-CM | POA: Insufficient documentation

## 2022-06-19 DIAGNOSIS — Z794 Long term (current) use of insulin: Secondary | ICD-10-CM | POA: Insufficient documentation

## 2022-06-19 DIAGNOSIS — Z7984 Long term (current) use of oral hypoglycemic drugs: Secondary | ICD-10-CM | POA: Insufficient documentation

## 2022-06-19 DIAGNOSIS — Z6831 Body mass index (BMI) 31.0-31.9, adult: Secondary | ICD-10-CM | POA: Insufficient documentation

## 2022-06-19 HISTORY — PX: POLYPECTOMY: SHX5525

## 2022-06-19 HISTORY — PX: ESOPHAGOGASTRODUODENOSCOPY (EGD) WITH PROPOFOL: SHX5813

## 2022-06-19 HISTORY — PX: HEMOSTASIS CLIP PLACEMENT: SHX6857

## 2022-06-19 LAB — GLUCOSE, CAPILLARY: Glucose-Capillary: 216 mg/dL — ABNORMAL HIGH (ref 70–99)

## 2022-06-19 SURGERY — ESOPHAGOGASTRODUODENOSCOPY (EGD) WITH PROPOFOL
Anesthesia: General

## 2022-06-19 MED ORDER — PHENYLEPHRINE 80 MCG/ML (10ML) SYRINGE FOR IV PUSH (FOR BLOOD PRESSURE SUPPORT)
PREFILLED_SYRINGE | INTRAVENOUS | Status: DC | PRN
  Administered 2022-06-19 (×2): 160 ug via INTRAVENOUS
  Administered 2022-06-19: 80 ug via INTRAVENOUS
  Administered 2022-06-19: 160 ug via INTRAVENOUS

## 2022-06-19 MED ORDER — LACTATED RINGERS IV SOLN: INTRAVENOUS | Status: DC | PRN

## 2022-06-19 MED ORDER — EPHEDRINE SULFATE-NACL 50-0.9 MG/10ML-% IV SOSY
PREFILLED_SYRINGE | INTRAVENOUS | Status: DC | PRN
  Administered 2022-06-19: 10 mg via INTRAVENOUS

## 2022-06-19 MED ORDER — PROPOFOL 10 MG/ML IV BOLUS
INTRAVENOUS | Status: DC | PRN
  Administered 2022-06-19: 100 mg via INTRAVENOUS

## 2022-06-19 MED ORDER — PROPOFOL 500 MG/50ML IV EMUL
INTRAVENOUS | Status: DC | PRN
  Administered 2022-06-19: 100 ug/kg/min via INTRAVENOUS

## 2022-06-19 MED ORDER — LIDOCAINE HCL (CARDIAC) PF 100 MG/5ML IV SOSY
PREFILLED_SYRINGE | INTRAVENOUS | Status: DC | PRN
  Administered 2022-06-19: 50 mg via INTRATRACHEAL

## 2022-06-19 NOTE — Op Note (Signed)
[facility] Patient Name: Christine Caldwell Procedure Date: 06/19/2022 11:56 AM MRN: [id number] Date of Birth: 01-06-1934 Attending MD: Maylon Peppers , , [id number] CSN: [id number] Age: 89 Admit Type: Outpatient Procedure:                Upper GI endoscopy Indications:              Follow-up of gastric polyps Providers:                Maylon Peppers, Lambert Mody, Aram Candela Referring MD:              Medicines:                Monitored Anesthesia Care Complications:            No immediate complications. Estimated Blood Loss:     Estimated blood loss: none. Procedure:                Pre-Anesthesia Assessment:                           - Prior to the procedure, a History and Physical                            was performed, and patient medications, allergies                            and sensitivities were reviewed. The patient's                            tolerance of previous anesthesia was reviewed.                           - The risks and benefits of the procedure and the                            sedation options and risks were discussed with the                            patient. All questions were answered and informed                            consent was obtained.                           - ASA Grade Assessment: III - A patient with severe                            systemic disease.                           After obtaining informed consent, the endoscope was                            passed under direct vision. Throughout the                            procedure, the patient's blood  pressure, pulse, and                            oxygen saturations were monitored continuously. The                            [id number]-[id number] scope was introduced through the                            mouth, and advanced to the second part of duodenum.                            The upper GI endoscopy was accomplished without                            difficulty. The  patient tolerated the procedure                            well. Scope In: 12:07:31 PM Scope Out: 12:30:58 PM Total Procedure Duration: 0 hours 23 minutes 27 seconds  Findings:      The examined esophagus was normal.      Multiple 3 to 8 mm pedunculated and sessile polyps with no bleeding and       stigmata of recent bleeding were found in the gastric body. Some of       these polyps appeared to be fundic gland polyps but other polyps had       adenomatous/hyperplastic changes upon inspection. Six polyps were       removed with a hot snare. Resection and retrieval were complete. To stop       active bleeding at one of the polypectomy sites, one hemostatic clip was       successfully placed. Clip manufacturer: [employer]. There was no       bleeding at the end of the procedure.      The examined duodenum was normal. Impression:               - Normal esophagus.                           - Multiple gastric polyps. Resected and retrieved.                            Clip was placed. Clip manufacturer: [employer].                           - Normal examined duodenum. Moderate Sedation:      Per Anesthesia Care Recommendation:           - Discharge patient to home (ambulatory).                           - Resume previous diet.                           - Await pathology  results.                           - No Excedrin, Goody/BC powders, ibuprofen,                            naproxen, or other non-steroidal anti-inflammatory                            drugs for 2 weeks. Procedure Code(s):        --- Professional ---                           [phone], 59, Esophagogastroduodenoscopy, flexible,                            transoral; with control of bleeding, any method                           43251, Esophagogastroduodenoscopy, flexible,                            transoral; with removal of tumor(s), polyp(s), or                            other  lesion(s) by snare technique Diagnosis Code(s):        --- Professional ---                           K31.7, Polyp of stomach and duodenum CPT copyright 2022 American Medical Association. All rights reserved. The codes documented in this report are preliminary and upon coder review may  be revised to meet current compliance requirements. Maylon Peppers, MD Maylon Peppers,  06/19/2022 12:40:01 PM This report has been signed electronically. Number of Addenda: 0

## 2022-06-19 NOTE — Discharge Instructions (Signed)
You are being discharged to home.  Resume your previous diet.  We are waiting for your pathology results.  Do not take any Excedrin, Goody/BC powders, ibuprofen (including Advil, Motrin or Nuprin), naproxen, or other non-steroidal anti-inflammatory drugs for 2 weeks.

## 2022-06-19 NOTE — Anesthesia Preprocedure Evaluation (Signed)
Anesthesia Evaluation  Patient identified by MRN, date of birth, ID band Patient awake    Reviewed: Allergy & Precautions, H&P , NPO status , Patient's Chart, lab work & pertinent test results, reviewed documented beta blocker date and time   Airway Mallampati: II  TM Distance: >3 FB Neck ROM: full    Dental no notable dental hx.    Pulmonary neg pulmonary ROS, former smoker   Pulmonary exam normal breath sounds clear to auscultation       Cardiovascular Exercise Tolerance: Good hypertension, + CAD  negative cardio ROS + dysrhythmias  Rhythm:regular Rate:Normal     Neuro/Psych  PSYCHIATRIC DISORDERS  Depression    negative neurological ROS  negative psych ROS   GI/Hepatic negative GI ROS, Neg liver ROS,GERD  ,,  Endo/Other  negative endocrine ROSdiabetes    Renal/GU negative Renal ROS  negative genitourinary   Musculoskeletal   Abdominal   Peds  Hematology negative hematology ROS (+)   Anesthesia Other Findings   Reproductive/Obstetrics negative OB ROS                             Anesthesia Physical Anesthesia Plan  ASA: 3  Anesthesia Plan: General   Post-op Pain Management:    Induction:   PONV Risk Score and Plan: Propofol infusion  Airway Management Planned:   Additional Equipment:   Intra-op Plan:   Post-operative Plan:   Informed Consent: I have reviewed the patients History and Physical, chart, labs and discussed the procedure including the risks, benefits and alternatives for the proposed anesthesia with the patient or authorized representative who has indicated his/her understanding and acceptance.     Dental Advisory Given  Plan Discussed with: CRNA  Anesthesia Plan Comments:        Anesthesia Quick Evaluation

## 2022-06-19 NOTE — H&P (Signed)
Christine Caldwell is an 89 y.o. female.   Chief Complaint: History of gastric polyps HPI:  89 year old female with past medical history of GERD, coronary artery disease, hyperlipidemia, hypertension, obesity, PVCs, choledocholithiasis and diabetes, who came to the hospital for evaluation of gastric polyps .  The patient denies any complaints. The patient denies having any nausea, vomiting, fever, chills, hematochezia, melena, hematemesis, abdominal distention, abdominal pain, diarrhea, jaundice, pruritus or weight loss.  She had 3 polyps removed from her stomach on 11/21/2021, one of them had active bleeding.  Polyps were hyperplastic without dysplasia but had focal erosion on the surface.  Other biopsies from the stomach were negative for H. pylori.  Past Medical History:  Diagnosis Date   Coronary artery disease (CAD) excluded    Myoview stress test 2012 within normal limits, normal LV function echocardiogram   Depression    Dysrhythmia    GERD (gastroesophageal reflux disease)    Hyperlipidemia    Total cholesterol 190   Hypertension    Obesity    Other and unspecified hyperlipidemia    Palpitations    . Longstanding palpitations.  Documented history of PACs and PVCs.   PVC's (premature ventricular contractions)     stable   Type 2 diabetes mellitus ([place])     Past Surgical History:  Procedure Laterality Date   BILIARY STENT PLACEMENT N/A 07/05/2021   Procedure: biliary stent placement;  Surgeon: Rogene Houston, MD;  Location: [facility];  Service: Endoscopy;  Laterality: N/A;   CARPAL TUNNEL RELEASE Left    ERCP N/A 07/05/2021   Procedure: ENDOSCOPIC RETROGRADE CHOLANGIOPANCREATOGRAPHY (ERCP);  Surgeon: Rogene Houston, MD;  Location: [facility];  Service: Endoscopy;  Laterality: N/A;   ERCP N/A 09/06/2021   Procedure: ENDOSCOPIC RETROGRADE CHOLANGIOPANCREATOGRAPHY (ERCP);  Surgeon: Rogene Houston, MD;  Location: [facility];  Service: Endoscopy;  Laterality: N/A;  [id number], pt knows to arrive  at 10:30   ESOPHAGOGASTRODUODENOSCOPY N/A 09/06/2021   Procedure: ESOPHAGOGASTRODUODENOSCOPY (EGD);  Surgeon: Rogene Houston, MD;  Location: [facility];  Service: Endoscopy;  Laterality: N/A;   ESOPHAGOGASTRODUODENOSCOPY (EGD) WITH PROPOFOL N/A 11/21/2021   Procedure: ESOPHAGOGASTRODUODENOSCOPY (EGD) WITH PROPOFOL;  Surgeon: Harvel Quale, MD;  Location: [facility];  Service: Gastroenterology;  Laterality: N/A;  1015  ASA 2   GASTROINTESTINAL STENT REMOVAL N/A 09/06/2021   Procedure: GASTROINTESTINAL STENT REMOVAL;  Surgeon: Rogene Houston, MD;  Location: [facility];  Service: Endoscopy;  Laterality: N/A;   HEMOSTASIS CLIP PLACEMENT  11/21/2021   Procedure: HEMOSTASIS CLIP PLACEMENT;  Surgeon: Harvel Quale, MD;  Location: [facility];  Service: Gastroenterology;;   POLYPECTOMY N/A 11/21/2021   Procedure: POLYPECTOMY;  Surgeon: Harvel Quale, MD;  [facility]: [facility];  Service: Gastroenterology;  Laterality: N/A;   REMOVAL OF STONES N/A 09/06/2021   Procedure: REMOVAL OF ONE STONE AND DEBRIDEMENT;  Surgeon: Rogene Houston, MD;  Location: [facility];  Service: Endoscopy;  Laterality: N/A;   SPHINCTEROTOMY N/A 07/05/2021   Procedure: SPHINCTEROTOMY with stone two extraction;  Surgeon: Rogene Houston, MD;  Location: [facility];  Service: Endoscopy;  Laterality: N/A;   SPHINCTEROTOMY N/A 09/06/2021   Procedure: SPHINCTEROTOMY;  Surgeon: Rogene Houston, MD;  Location: [facility];  Service: Endoscopy;  Laterality: N/A;   SUBMUCOSAL INJECTION  11/21/2021   Procedure: SUBMUCOSAL INJECTION;  Surgeon: Harvel Quale, MD;  Location: [facility];  Service: Gastroenterology;;   TOTAL ABDOMINAL HYSTERECTOMY      Family History  Problem Relation  Age of Onset   Thyroid disease Mother    Parkinson's disease Sister    Liver cancer Sister    Social History:  reports that she quit smoking about 39 years ago. Her smoking use included cigarettes. She has a 3.00  pack-year smoking history. She has never used smokeless tobacco. She reports that she does not drink alcohol and does not use drugs.  Allergies: No Known Allergies  Medications Prior to Admission  Medication Sig Dispense Refill   diltiazem (CARDIZEM CD) 240 MG 24 hr capsule Take 1 capsule (240 mg total) by mouth daily. 90 capsule 3   ferrous sulfate 325 (65 FE) MG tablet Take 325 mg by mouth 2 (two) times daily with a meal.     metoprolol succinate (TOPROL-XL) 100 MG 24 hr tablet Take 100 mg by mouth in the morning and at bedtime.     omeprazole (PRILOSEC) 40 MG capsule Take 1 capsule (40 mg total) by mouth 2 (two) times daily. 60 capsule 1   rosuvastatin (CRESTOR) 10 MG tablet Take 10 mg by mouth daily after breakfast.     aspirin EC 81 MG tablet Take 81 mg by mouth in the morning. Swallow whole.     aspirin-acetaminophen-caffeine (EXCEDRIN MIGRAINE) 250-250-65 MG tablet Take 1 tablet by mouth every 6 (six) hours as needed for headache.     citalopram (CELEXA) 20 MG tablet Take 20 mg by mouth daily.     Cyanocobalamin 1000 MCG/ML KIT Inject 1,000 mcg as directed every 30 (thirty) days. Once a month     fluticasone (CUTIVATE) 0.05 % cream Apply 1 Application topically daily.     gabapentin (NEURONTIN) 300 MG capsule Take by mouth.     glipiZIDE (GLUCOTROL XL) 5 MG 24 hr tablet Take 5 mg by mouth 2 (two) times daily.     ketoconazole (NIZORAL) 2 % cream Apply 1 Application topically 2 (two) times daily.     LANTUS SOLOSTAR 100 UNIT/ML Solostar Pen Inject into the skin.     metFORMIN (GLUCOPHAGE) 500 MG tablet Take 500 mg by mouth daily with breakfast.     omeprazole (PRILOSEC) 20 MG capsule Take 20 mg by mouth 2 (two) times daily.     Polyethyl Glycol-Propyl Glycol (SYSTANE OP) Place 1 drop into both eyes daily as needed (dry eyes).     tacrolimus (PROTOPIC) 0.1 % ointment Apply 1 Application topically 2 (two) times daily as needed for irritation.      Results for orders placed or performed  during the hospital encounter of 06/19/22 (from the past 48 hour(s))  Glucose, capillary     Status: Abnormal   Collection Time: 06/19/22 10:46 AM  Result Value Ref Range   Glucose-Capillary 216 (H) 70 - 99 mg/dL    Comment: Glucose reference range applies only to samples taken after fasting for at least 8 hours.   No results found.  Review of Systems  All other systems reviewed and are negative.   Blood pressure 109/71, pulse (!) 57, temperature 98.6 F (37 C), temperature source Oral, resp. rate 15, height 5' 2"$  (1.575 m), weight 77.1 kg, SpO2 97 %. Physical Exam  GENERAL: The patient is AO x3, in no acute distress. HEENT: Head is normocephalic and atraumatic. EOMI are intact. Mouth is well hydrated and without lesions. NECK: Supple. No masses LUNGS: Clear to auscultation. No presence of rhonchi/wheezing/rales. Adequate chest expansion HEART: RRR, normal s1 and s2. ABDOMEN: Soft, nontender, no guarding, no peritoneal signs, and nondistended. BS +. No  masses. EXTREMITIES: Without any cyanosis, clubbing, rash, lesions or edema. NEUROLOGIC: AOx3, no focal motor deficit. SKIN: no jaundice, no rashes  Assessment/Plan 89 year old female with past medical history of GERD, coronary artery disease, hyperlipidemia, hypertension, obesity, PVCs, choledocholithiasis and diabetes, who came to the hospital for evaluation of gastric polyps .  Will proceed with EGD.  Harvel Quale, MD 06/19/2022, 10:58 AM

## 2022-06-19 NOTE — Transfer of Care (Signed)
Immediate Anesthesia Transfer of Care Note  Patient: Christine Caldwell  Procedure(s) Performed: ESOPHAGOGASTRODUODENOSCOPY (EGD) WITH PROPOFOL POLYPECTOMY HEMOSTASIS CLIP PLACEMENT  Patient Location: PACU  Anesthesia Type:General  Level of Consciousness: awake, alert , oriented, and patient cooperative  Airway & Oxygen Therapy: Patient Spontanous Breathing  Post-op Assessment: Report given to RN, Post -op Vital signs reviewed and stable, and Patient moving all extremities X 4  Post vital signs: Reviewed and stable  Last Vitals:  Vitals Value Taken Time  BP 108/54 06/19/22 1234  Temp 36.3 C 06/19/22 1234  Pulse 62 06/19/22 1234  Resp 14 06/19/22 1234  SpO2 99 % 06/19/22 1234    Last Pain:  Vitals:   06/19/22 1234  TempSrc: Axillary  PainSc: 0-No pain         Complications: No notable events documented.

## 2022-06-20 LAB — SURGICAL PATHOLOGY

## 2022-06-22 NOTE — Anesthesia Postprocedure Evaluation (Signed)
Anesthesia Post Note  Patient: Christine Caldwell  Procedure(s) Performed: ESOPHAGOGASTRODUODENOSCOPY (EGD) WITH PROPOFOL POLYPECTOMY HEMOSTASIS CLIP PLACEMENT  Patient location during evaluation: Phase II Anesthesia Type: General Level of consciousness: awake Pain management: pain level controlled Vital Signs Assessment: post-procedure vital signs reviewed and stable Respiratory status: spontaneous breathing and respiratory function stable Cardiovascular status: blood pressure returned to baseline and stable Postop Assessment: no headache and no apparent nausea or vomiting Anesthetic complications: no Comments: Late entry   No notable events documented.   Last Vitals:  Vitals:   06/19/22 1031 06/19/22 1234  BP: 109/71 (!) 108/54  Pulse: (!) 57 62  Resp: 15 14  Temp: 37 C (!) 36.3 C  SpO2: 97% 99%    Last Pain:  Vitals:   06/19/22 1234  TempSrc: Axillary  PainSc: 0-No pain                 Louann Sjogren

## 2022-06-25 ENCOUNTER — Encounter (HOSPITAL_COMMUNITY): Payer: Self-pay | Admitting: Gastroenterology

## 2022-06-27 DIAGNOSIS — M545 Low back pain, unspecified: Secondary | ICD-10-CM | POA: Diagnosis not present

## 2022-06-29 DIAGNOSIS — I10 Essential (primary) hypertension: Secondary | ICD-10-CM | POA: Diagnosis not present

## 2022-06-29 DIAGNOSIS — E1122 Type 2 diabetes mellitus with diabetic chronic kidney disease: Secondary | ICD-10-CM | POA: Diagnosis not present

## 2022-06-29 DIAGNOSIS — Z299 Encounter for prophylactic measures, unspecified: Secondary | ICD-10-CM | POA: Diagnosis not present

## 2022-06-29 DIAGNOSIS — I700 Atherosclerosis of aorta: Secondary | ICD-10-CM | POA: Diagnosis not present

## 2022-07-02 DIAGNOSIS — M545 Low back pain, unspecified: Secondary | ICD-10-CM | POA: Diagnosis not present

## 2022-07-04 DIAGNOSIS — M545 Low back pain, unspecified: Secondary | ICD-10-CM | POA: Diagnosis not present

## 2022-07-04 DIAGNOSIS — E119 Type 2 diabetes mellitus without complications: Secondary | ICD-10-CM | POA: Diagnosis not present

## 2022-07-10 DIAGNOSIS — E538 Deficiency of other specified B group vitamins: Secondary | ICD-10-CM | POA: Diagnosis not present

## 2022-07-21 IMAGING — MR MR LUMBAR SPINE W/O CM
5 series · 31 of 48 positions shown · non-contrast
Comparison: None Available.

CLINICAL DATA: Low back pain and left lower extremity
radiculopathy.

Low back pain, unspecified back pain laterality, unspecified
chronicity, unspecified whether sciatica present 5I8.IR ([id number])
EXAM:
MRI LUMBAR SPINE WITHOUT CONTRAST
TECHNIQUE: Multiplanar, multisequence MR imaging of the lumbar spine was
performed. No intravenous contrast was administered.

[Series 5: T2 · sagittal · 4.0mm · 0.68mm/px · 6 of 15 slices shown (1 of 2)]
[im 1/15]
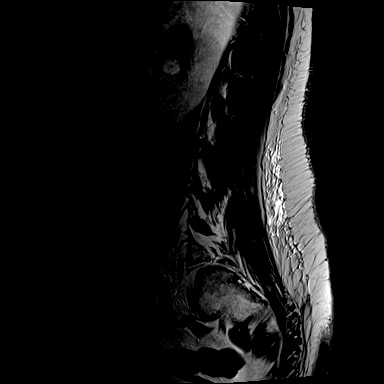
[im 3/15]
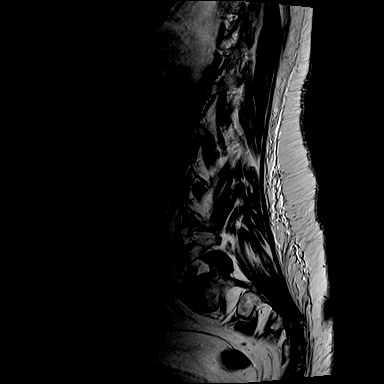
[im 6/15]
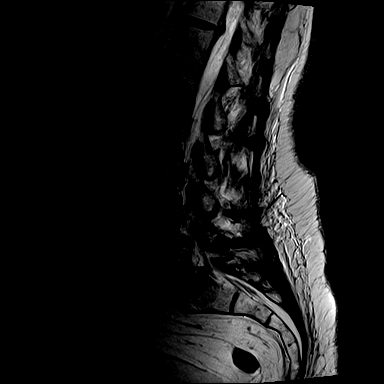
[im 9/15]
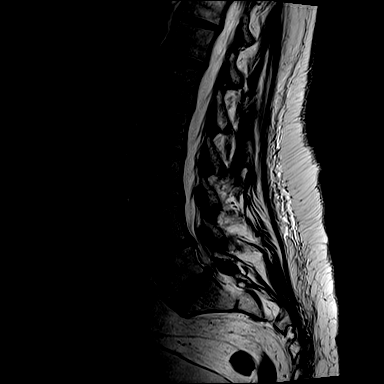
[im 12/15]
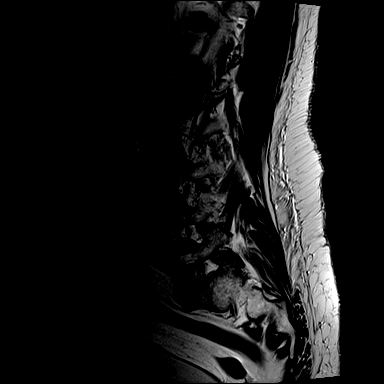
[im 15/15]
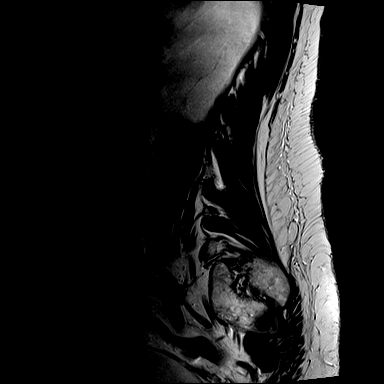

[Series 6: T1 · sagittal · 4.0mm · 0.81mm/px · 7 of 15 slices shown (1 of 2)]
[im 1/15]
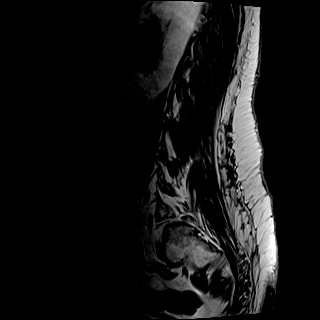
[im 3/15]
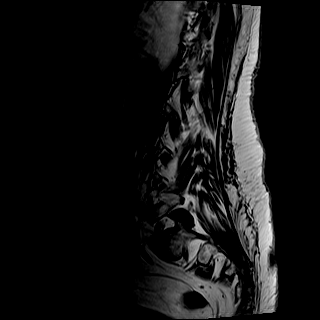
[im 5/15]
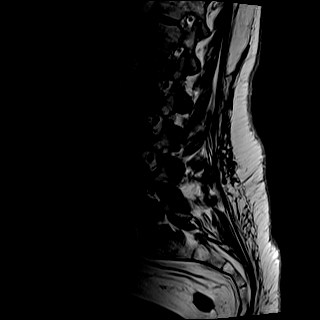
[im 8/15]
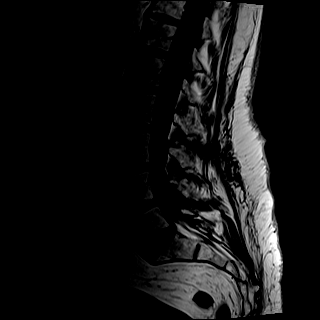
[im 10/15]
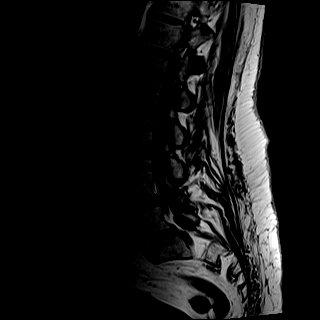
[im 12/15]
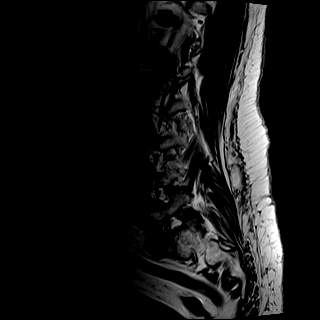
[im 15/15]
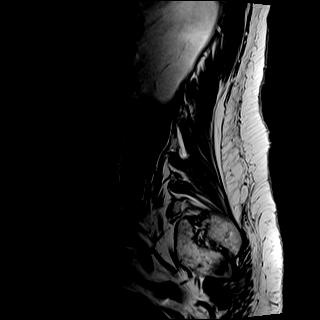

[Series 7: STIR · sagittal · 4.0mm · 0.51mm/px · 2 of 15 slices shown]
[im 1/15]
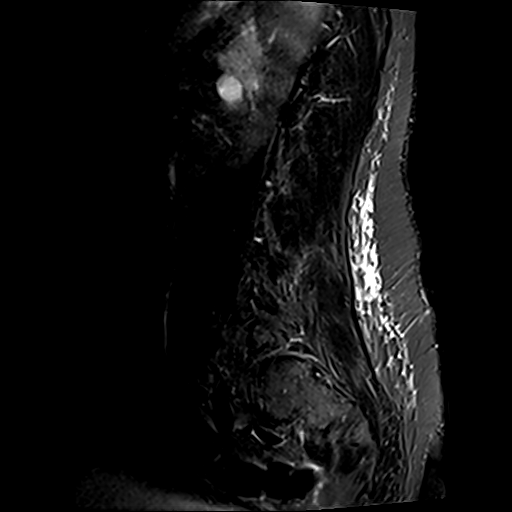
[im 3/15]
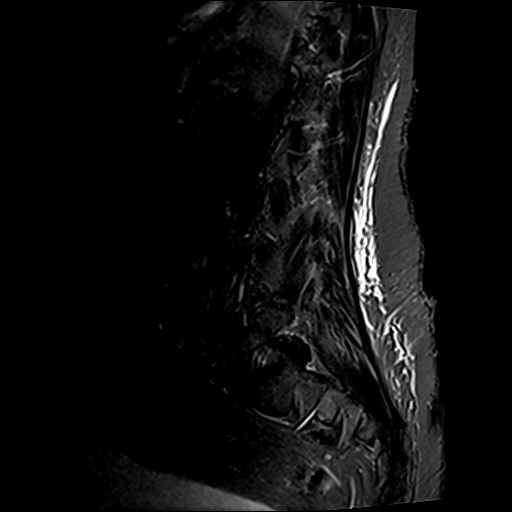

[Series 8: T2 · axial · 4.0mm · 0.70mm/px · z∈[-63,+101]mm · 8 of 30 slices shown (2 of 2)]
[im 1/30]
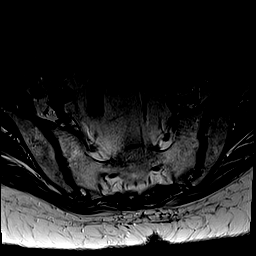
[im 5/30]
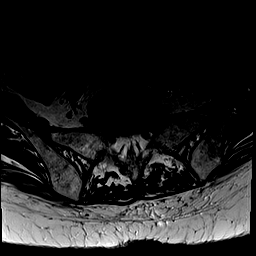
[im 9/30]
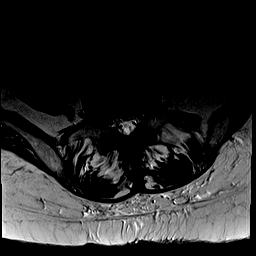
[im 14/30]
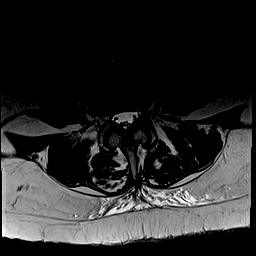
[im 16/30]
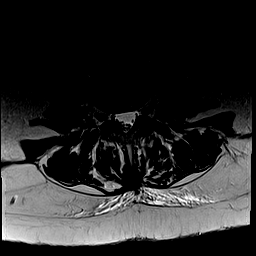
[im 21/30]
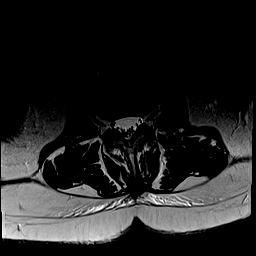
[im 25/30]
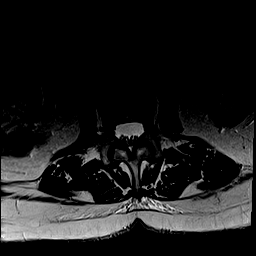
[im 30/30]
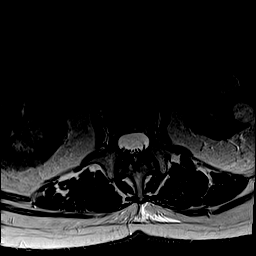

[Series 9: T1 · axial · 4.0mm · 0.35mm/px · z∈[-63,+101]mm · 8 of 30 slices shown (2 of 2)]
[im 1/30]
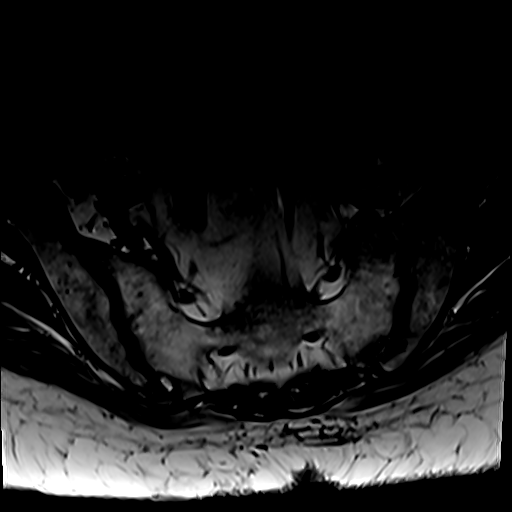
[im 5/30]
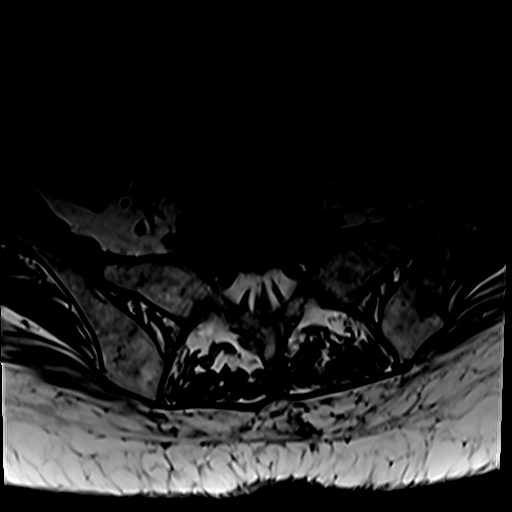
[im 9/30]
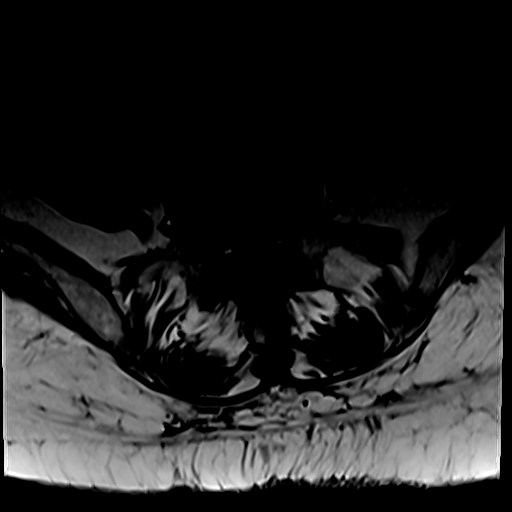
[im 14/30]
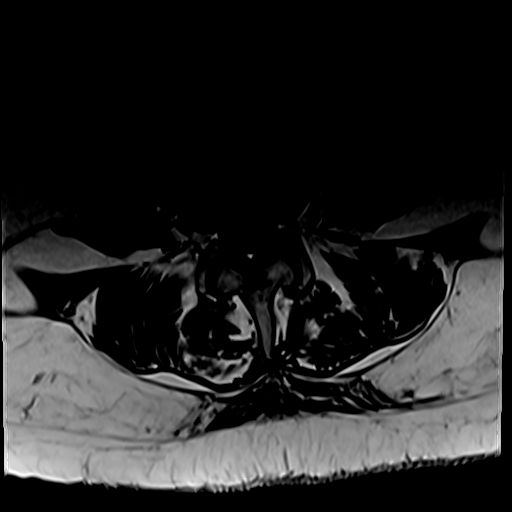
[im 16/30]
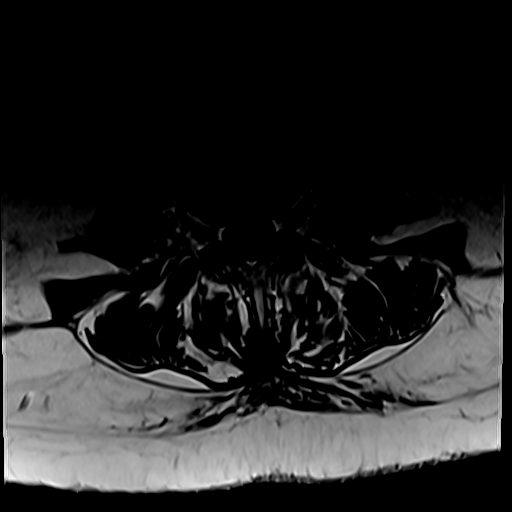
[im 21/30]
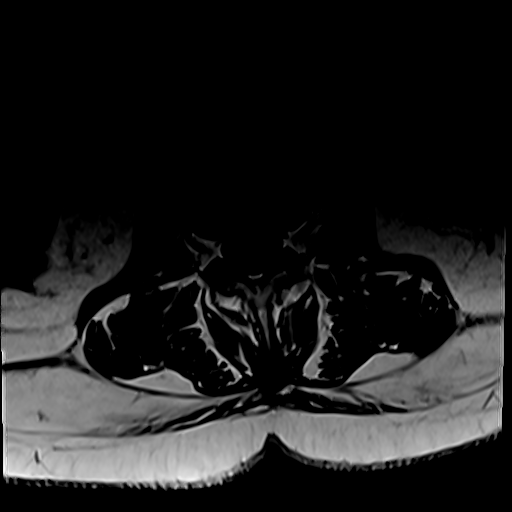
[im 25/30]
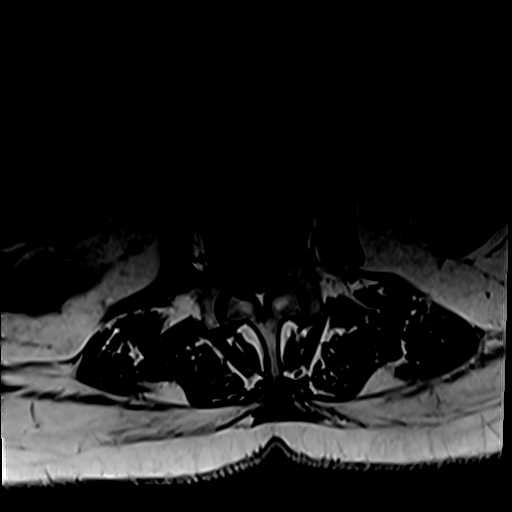
[im 30/30]
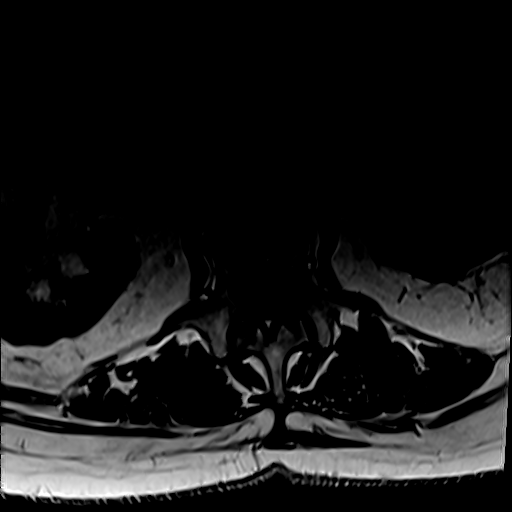

[31 of 48 positions shown; findings below may reference images not displayed]

FINDINGS: Segmentation:  Standard.

Alignment:  Grade 1 anterolisthesis at L4-5

Vertebrae:  No fracture, evidence of discitis, or bone lesion.

Conus medullaris and cauda equina: Conus extends to the T12 level.
Conus and cauda equina appear normal.

Paraspinal and other soft tissues: Negative.

Disc levels:

T11-12: Medium-sized central disc extrusion with inferior migration.
No associated stenosis.

L1-L2: Normal disc space and facet joints. No spinal canal stenosis.
No neural foraminal stenosis.

L2-L3: Normal disc space and facet joints. No spinal canal stenosis.
No neural foraminal stenosis.

L3-L4: Normal disc space and facet joints. No spinal canal stenosis.
No neural foraminal stenosis.

L4-L5: Mild facet hypertrophy with medium-sized disc bulge and
superimposed left subarticular protrusion. Left lateral recess
narrowing with mild central spinal canal stenosis. No neural
foraminal stenosis.

L5-S1: Small central disc protrusion. No spinal canal stenosis. No
neural foraminal stenosis.

Visualized sacrum: Normal.
IMPRESSION: 1. L4-L5 left lateral recess stenosis and mild central spinal canal
stenosis, which may serve as a source of left L5 radiculopathy.
2. Small central disc protrusion at L5-S1 without associated
stenosis.
3. Medium-sized central disc extrusion with inferior migration at
T11-T12 without associated stenosis.

## 2022-07-24 DIAGNOSIS — M5416 Radiculopathy, lumbar region: Secondary | ICD-10-CM | POA: Diagnosis not present

## 2022-08-04 DIAGNOSIS — E119 Type 2 diabetes mellitus without complications: Secondary | ICD-10-CM | POA: Diagnosis not present

## 2022-08-09 DIAGNOSIS — Z08 Encounter for follow-up examination after completed treatment for malignant neoplasm: Secondary | ICD-10-CM | POA: Diagnosis not present

## 2022-08-09 DIAGNOSIS — Z85828 Personal history of other malignant neoplasm of skin: Secondary | ICD-10-CM | POA: Diagnosis not present

## 2022-08-10 DIAGNOSIS — E538 Deficiency of other specified B group vitamins: Secondary | ICD-10-CM | POA: Diagnosis not present

## 2022-08-30 DIAGNOSIS — Z299 Encounter for prophylactic measures, unspecified: Secondary | ICD-10-CM | POA: Diagnosis not present

## 2022-08-30 DIAGNOSIS — I10 Essential (primary) hypertension: Secondary | ICD-10-CM | POA: Diagnosis not present

## 2022-08-30 DIAGNOSIS — Z0000 Encounter for general adult medical examination without abnormal findings: Secondary | ICD-10-CM | POA: Diagnosis not present

## 2022-08-30 DIAGNOSIS — I700 Atherosclerosis of aorta: Secondary | ICD-10-CM | POA: Diagnosis not present

## 2022-08-30 DIAGNOSIS — Z7189 Other specified counseling: Secondary | ICD-10-CM | POA: Diagnosis not present

## 2022-09-04 DIAGNOSIS — E119 Type 2 diabetes mellitus without complications: Secondary | ICD-10-CM | POA: Diagnosis not present

## 2022-09-10 DIAGNOSIS — E538 Deficiency of other specified B group vitamins: Secondary | ICD-10-CM | POA: Diagnosis not present

## 2022-10-05 DIAGNOSIS — E119 Type 2 diabetes mellitus without complications: Secondary | ICD-10-CM | POA: Diagnosis not present

## 2022-10-11 DIAGNOSIS — E538 Deficiency of other specified B group vitamins: Secondary | ICD-10-CM | POA: Diagnosis not present

## 2022-10-25 DIAGNOSIS — C4441 Basal cell carcinoma of skin of scalp and neck: Secondary | ICD-10-CM | POA: Diagnosis not present

## 2022-10-31 DIAGNOSIS — R5383 Other fatigue: Secondary | ICD-10-CM | POA: Diagnosis not present

## 2022-10-31 DIAGNOSIS — Z299 Encounter for prophylactic measures, unspecified: Secondary | ICD-10-CM | POA: Diagnosis not present

## 2022-10-31 DIAGNOSIS — Z0000 Encounter for general adult medical examination without abnormal findings: Secondary | ICD-10-CM | POA: Diagnosis not present

## 2022-10-31 DIAGNOSIS — I10 Essential (primary) hypertension: Secondary | ICD-10-CM | POA: Diagnosis not present

## 2022-10-31 DIAGNOSIS — E7800 Pure hypercholesterolemia, unspecified: Secondary | ICD-10-CM | POA: Diagnosis not present

## 2022-10-31 DIAGNOSIS — E1165 Type 2 diabetes mellitus with hyperglycemia: Secondary | ICD-10-CM | POA: Diagnosis not present

## 2022-10-31 DIAGNOSIS — I700 Atherosclerosis of aorta: Secondary | ICD-10-CM | POA: Diagnosis not present

## 2022-10-31 DIAGNOSIS — Z79899 Other long term (current) drug therapy: Secondary | ICD-10-CM | POA: Diagnosis not present

## 2022-11-04 DIAGNOSIS — E119 Type 2 diabetes mellitus without complications: Secondary | ICD-10-CM | POA: Diagnosis not present

## 2022-11-13 DIAGNOSIS — E538 Deficiency of other specified B group vitamins: Secondary | ICD-10-CM | POA: Diagnosis not present

## 2022-11-19 DIAGNOSIS — Z299 Encounter for prophylactic measures, unspecified: Secondary | ICD-10-CM | POA: Diagnosis not present

## 2022-11-19 DIAGNOSIS — N184 Chronic kidney disease, stage 4 (severe): Secondary | ICD-10-CM | POA: Diagnosis not present

## 2022-11-19 DIAGNOSIS — D692 Other nonthrombocytopenic purpura: Secondary | ICD-10-CM | POA: Diagnosis not present

## 2022-11-19 DIAGNOSIS — E538 Deficiency of other specified B group vitamins: Secondary | ICD-10-CM | POA: Diagnosis not present

## 2022-11-19 DIAGNOSIS — I10 Essential (primary) hypertension: Secondary | ICD-10-CM | POA: Diagnosis not present

## 2022-12-05 DIAGNOSIS — E119 Type 2 diabetes mellitus without complications: Secondary | ICD-10-CM | POA: Diagnosis not present

## 2022-12-14 DIAGNOSIS — E538 Deficiency of other specified B group vitamins: Secondary | ICD-10-CM | POA: Diagnosis not present

## 2022-12-20 DIAGNOSIS — C4441 Basal cell carcinoma of skin of scalp and neck: Secondary | ICD-10-CM | POA: Diagnosis not present

## 2023-01-05 DIAGNOSIS — E119 Type 2 diabetes mellitus without complications: Secondary | ICD-10-CM | POA: Diagnosis not present

## 2023-01-08 DIAGNOSIS — E119 Type 2 diabetes mellitus without complications: Secondary | ICD-10-CM | POA: Diagnosis not present

## 2023-01-11 DIAGNOSIS — E538 Deficiency of other specified B group vitamins: Secondary | ICD-10-CM | POA: Diagnosis not present

## 2023-01-31 DIAGNOSIS — C4441 Basal cell carcinoma of skin of scalp and neck: Secondary | ICD-10-CM | POA: Diagnosis not present

## 2023-01-31 DIAGNOSIS — B9689 Other specified bacterial agents as the cause of diseases classified elsewhere: Secondary | ICD-10-CM | POA: Diagnosis not present

## 2023-01-31 DIAGNOSIS — L0202 Furuncle of face: Secondary | ICD-10-CM | POA: Diagnosis not present

## 2023-02-04 DIAGNOSIS — E119 Type 2 diabetes mellitus without complications: Secondary | ICD-10-CM | POA: Diagnosis not present

## 2023-02-11 DIAGNOSIS — I10 Essential (primary) hypertension: Secondary | ICD-10-CM | POA: Diagnosis not present

## 2023-02-11 DIAGNOSIS — E1165 Type 2 diabetes mellitus with hyperglycemia: Secondary | ICD-10-CM | POA: Diagnosis not present

## 2023-02-11 DIAGNOSIS — Z299 Encounter for prophylactic measures, unspecified: Secondary | ICD-10-CM | POA: Diagnosis not present

## 2023-02-12 DIAGNOSIS — C4441 Basal cell carcinoma of skin of scalp and neck: Secondary | ICD-10-CM | POA: Diagnosis not present

## 2023-02-15 DIAGNOSIS — E538 Deficiency of other specified B group vitamins: Secondary | ICD-10-CM | POA: Diagnosis not present

## 2023-03-06 DIAGNOSIS — C4441 Basal cell carcinoma of skin of scalp and neck: Secondary | ICD-10-CM | POA: Diagnosis not present

## 2023-03-06 DIAGNOSIS — E119 Type 2 diabetes mellitus without complications: Secondary | ICD-10-CM | POA: Diagnosis not present

## 2023-03-15 DIAGNOSIS — E538 Deficiency of other specified B group vitamins: Secondary | ICD-10-CM | POA: Diagnosis not present

## 2023-03-27 ENCOUNTER — Other Ambulatory Visit (HOSPITAL_COMMUNITY): Payer: Self-pay | Admitting: Neurosurgery

## 2023-03-27 DIAGNOSIS — M48062 Spinal stenosis, lumbar region with neurogenic claudication: Secondary | ICD-10-CM | POA: Diagnosis not present

## 2023-04-01 ENCOUNTER — Encounter (HOSPITAL_COMMUNITY): Payer: Self-pay

## 2023-04-01 ENCOUNTER — Ambulatory Visit (HOSPITAL_COMMUNITY): Payer: Medicare Other

## 2023-04-05 DIAGNOSIS — E119 Type 2 diabetes mellitus without complications: Secondary | ICD-10-CM | POA: Diagnosis not present

## 2023-04-13 ENCOUNTER — Ambulatory Visit (HOSPITAL_COMMUNITY)
Admission: RE | Admit: 2023-04-13 | Discharge: 2023-04-13 | Disposition: A | Discharge status: Home / Self Care | Payer: Medicare Other | Source: Ambulatory Visit | Attending: Neurosurgery | Admitting: Neurosurgery

## 2023-04-13 DIAGNOSIS — M4807 Spinal stenosis, lumbosacral region: Secondary | ICD-10-CM | POA: Diagnosis not present

## 2023-04-13 DIAGNOSIS — M48062 Spinal stenosis, lumbar region with neurogenic claudication: Secondary | ICD-10-CM | POA: Diagnosis not present

## 2023-04-13 DIAGNOSIS — M549 Dorsalgia, unspecified: Secondary | ICD-10-CM | POA: Diagnosis not present

## 2023-04-13 DIAGNOSIS — M5126 Other intervertebral disc displacement, lumbar region: Secondary | ICD-10-CM | POA: Diagnosis not present

## 2023-04-18 DIAGNOSIS — E538 Deficiency of other specified B group vitamins: Secondary | ICD-10-CM | POA: Diagnosis not present

## 2023-04-23 DIAGNOSIS — S0100XD Unspecified open wound of scalp, subsequent encounter: Secondary | ICD-10-CM | POA: Diagnosis not present

## 2023-05-06 DIAGNOSIS — E119 Type 2 diabetes mellitus without complications: Secondary | ICD-10-CM | POA: Diagnosis not present

## 2023-05-14 DIAGNOSIS — L905 Scar conditions and fibrosis of skin: Secondary | ICD-10-CM | POA: Diagnosis not present

## 2023-05-14 DIAGNOSIS — Z08 Encounter for follow-up examination after completed treatment for malignant neoplasm: Secondary | ICD-10-CM | POA: Diagnosis not present

## 2023-05-14 DIAGNOSIS — Z85828 Personal history of other malignant neoplasm of skin: Secondary | ICD-10-CM | POA: Diagnosis not present

## 2023-05-15 DIAGNOSIS — E538 Deficiency of other specified B group vitamins: Secondary | ICD-10-CM | POA: Diagnosis not present

## 2023-05-30 DIAGNOSIS — M4310 Spondylolisthesis, site unspecified: Secondary | ICD-10-CM | POA: Diagnosis not present

## 2023-05-30 DIAGNOSIS — M48062 Spinal stenosis, lumbar region with neurogenic claudication: Secondary | ICD-10-CM | POA: Diagnosis not present

## 2023-06-05 DIAGNOSIS — E119 Type 2 diabetes mellitus without complications: Secondary | ICD-10-CM | POA: Diagnosis not present

## 2023-06-05 DIAGNOSIS — E1165 Type 2 diabetes mellitus with hyperglycemia: Secondary | ICD-10-CM | POA: Diagnosis not present

## 2023-06-05 DIAGNOSIS — G319 Degenerative disease of nervous system, unspecified: Secondary | ICD-10-CM | POA: Diagnosis not present

## 2023-06-05 DIAGNOSIS — I10 Essential (primary) hypertension: Secondary | ICD-10-CM | POA: Diagnosis not present

## 2023-06-05 DIAGNOSIS — I739 Peripheral vascular disease, unspecified: Secondary | ICD-10-CM | POA: Diagnosis not present

## 2023-06-05 DIAGNOSIS — Z299 Encounter for prophylactic measures, unspecified: Secondary | ICD-10-CM | POA: Diagnosis not present

## 2023-06-06 DIAGNOSIS — Z08 Encounter for follow-up examination after completed treatment for malignant neoplasm: Secondary | ICD-10-CM | POA: Diagnosis not present

## 2023-06-06 DIAGNOSIS — L570 Actinic keratosis: Secondary | ICD-10-CM | POA: Diagnosis not present

## 2023-06-06 DIAGNOSIS — X32XXXD Exposure to sunlight, subsequent encounter: Secondary | ICD-10-CM | POA: Diagnosis not present

## 2023-06-06 DIAGNOSIS — Z85828 Personal history of other malignant neoplasm of skin: Secondary | ICD-10-CM | POA: Diagnosis not present

## 2023-06-13 DIAGNOSIS — E538 Deficiency of other specified B group vitamins: Secondary | ICD-10-CM | POA: Diagnosis not present

## 2023-06-21 ENCOUNTER — Telehealth: Payer: Self-pay | Admitting: *Deleted

## 2023-06-21 NOTE — Telephone Encounter (Signed)
   Pre-operative Risk Assessment    Patient Name: Christine Caldwell  DOB: [birth year] MRN: [id number]   Date of last office visit: 11/13/2016 DR. KONESWARAN Date of next office visit: NONE- PT WILL NEED A NEW PT APPT AS LAST SEEN 2018   Request for Surgical Clearance    Procedure:  L4-5 LUMBAR FUSION  Date of Surgery:  Clearance TBD                                Surgeon:  DR. Julio Sicks Surgeon's Group or Practice Name:  [facility] Phone number:  [phone] Fax number:  [phone] ATTN: [place] EXT [id number]   Type of Clearance Requested:   - Medical  - Pharmacy:  Hold Aspirin     Type of Anesthesia:  General    Additional requests/questions:    Elpidio Anis   06/21/2023, 9:43 AM

## 2023-07-03 NOTE — Telephone Encounter (Signed)
 I will update the surgeon office that the pt needs to call our office [phone] to schedule new pt appt for preop clearance. Our scheduling team has tried to reach the pt, though she has not called back.

## 2023-07-05 DIAGNOSIS — E119 Type 2 diabetes mellitus without complications: Secondary | ICD-10-CM | POA: Diagnosis not present

## 2023-07-11 DIAGNOSIS — E538 Deficiency of other specified B group vitamins: Secondary | ICD-10-CM | POA: Diagnosis not present

## 2023-07-29 NOTE — Telephone Encounter (Signed)
 Requesting office sent duplicate request. The pt needs a NEW PT APPT FOR PREOP CLEARANCE. Our office tried to reach the pt x 3 previously. Pt needs to call back to [phone] for new pt appt.

## 2023-08-04 DIAGNOSIS — E119 Type 2 diabetes mellitus without complications: Secondary | ICD-10-CM | POA: Diagnosis not present

## 2023-08-09 NOTE — Telephone Encounter (Signed)
 Daughter Shawna Orleans) called to report that patient is not having the surgery.

## 2023-08-13 DIAGNOSIS — E538 Deficiency of other specified B group vitamins: Secondary | ICD-10-CM | POA: Diagnosis not present

## 2023-08-22 DIAGNOSIS — Z85828 Personal history of other malignant neoplasm of skin: Secondary | ICD-10-CM | POA: Diagnosis not present

## 2023-08-22 DIAGNOSIS — Z08 Encounter for follow-up examination after completed treatment for malignant neoplasm: Secondary | ICD-10-CM | POA: Diagnosis not present

## 2023-08-22 DIAGNOSIS — L304 Erythema intertrigo: Secondary | ICD-10-CM | POA: Diagnosis not present

## 2023-09-04 DIAGNOSIS — E119 Type 2 diabetes mellitus without complications: Secondary | ICD-10-CM | POA: Diagnosis not present

## 2023-09-11 DIAGNOSIS — I10 Essential (primary) hypertension: Secondary | ICD-10-CM | POA: Diagnosis not present

## 2023-09-11 DIAGNOSIS — E538 Deficiency of other specified B group vitamins: Secondary | ICD-10-CM | POA: Diagnosis not present

## 2023-09-11 DIAGNOSIS — Z299 Encounter for prophylactic measures, unspecified: Secondary | ICD-10-CM | POA: Diagnosis not present

## 2023-09-11 DIAGNOSIS — E1122 Type 2 diabetes mellitus with diabetic chronic kidney disease: Secondary | ICD-10-CM | POA: Diagnosis not present

## 2023-09-11 DIAGNOSIS — E1165 Type 2 diabetes mellitus with hyperglycemia: Secondary | ICD-10-CM | POA: Diagnosis not present

## 2023-09-11 DIAGNOSIS — E113292 Type 2 diabetes mellitus with mild nonproliferative diabetic retinopathy without macular edema, left eye: Secondary | ICD-10-CM | POA: Diagnosis not present

## 2023-10-05 DIAGNOSIS — E119 Type 2 diabetes mellitus without complications: Secondary | ICD-10-CM | POA: Diagnosis not present

## 2023-10-08 DIAGNOSIS — Z0000 Encounter for general adult medical examination without abnormal findings: Secondary | ICD-10-CM | POA: Diagnosis not present

## 2023-10-08 DIAGNOSIS — Z299 Encounter for prophylactic measures, unspecified: Secondary | ICD-10-CM | POA: Diagnosis not present

## 2023-10-08 DIAGNOSIS — Z1389 Encounter for screening for other disorder: Secondary | ICD-10-CM | POA: Diagnosis not present

## 2023-10-08 DIAGNOSIS — E538 Deficiency of other specified B group vitamins: Secondary | ICD-10-CM | POA: Diagnosis not present

## 2023-10-08 DIAGNOSIS — Z713 Dietary counseling and surveillance: Secondary | ICD-10-CM | POA: Diagnosis not present

## 2023-10-08 DIAGNOSIS — Z7189 Other specified counseling: Secondary | ICD-10-CM | POA: Diagnosis not present

## 2023-10-08 DIAGNOSIS — I10 Essential (primary) hypertension: Secondary | ICD-10-CM | POA: Diagnosis not present

## 2023-10-23 DIAGNOSIS — L570 Actinic keratosis: Secondary | ICD-10-CM | POA: Diagnosis not present

## 2023-10-23 DIAGNOSIS — X32XXXD Exposure to sunlight, subsequent encounter: Secondary | ICD-10-CM | POA: Diagnosis not present

## 2023-11-04 DIAGNOSIS — E119 Type 2 diabetes mellitus without complications: Secondary | ICD-10-CM | POA: Diagnosis not present

## 2023-11-28 DIAGNOSIS — E538 Deficiency of other specified B group vitamins: Secondary | ICD-10-CM | POA: Diagnosis not present

## 2023-12-05 DIAGNOSIS — E119 Type 2 diabetes mellitus without complications: Secondary | ICD-10-CM | POA: Diagnosis not present

## 2023-12-24 DIAGNOSIS — I10 Essential (primary) hypertension: Secondary | ICD-10-CM | POA: Diagnosis not present

## 2023-12-24 DIAGNOSIS — Z299 Encounter for prophylactic measures, unspecified: Secondary | ICD-10-CM | POA: Diagnosis not present

## 2023-12-24 DIAGNOSIS — E7800 Pure hypercholesterolemia, unspecified: Secondary | ICD-10-CM | POA: Diagnosis not present

## 2023-12-24 DIAGNOSIS — E1122 Type 2 diabetes mellitus with diabetic chronic kidney disease: Secondary | ICD-10-CM | POA: Diagnosis not present

## 2023-12-24 DIAGNOSIS — R5383 Other fatigue: Secondary | ICD-10-CM | POA: Diagnosis not present

## 2023-12-24 DIAGNOSIS — Z0000 Encounter for general adult medical examination without abnormal findings: Secondary | ICD-10-CM | POA: Diagnosis not present

## 2023-12-25 DIAGNOSIS — E7800 Pure hypercholesterolemia, unspecified: Secondary | ICD-10-CM | POA: Diagnosis not present

## 2023-12-25 DIAGNOSIS — R5383 Other fatigue: Secondary | ICD-10-CM | POA: Diagnosis not present

## 2023-12-25 DIAGNOSIS — Z79899 Other long term (current) drug therapy: Secondary | ICD-10-CM | POA: Diagnosis not present

## 2023-12-26 DIAGNOSIS — Z08 Encounter for follow-up examination after completed treatment for malignant neoplasm: Secondary | ICD-10-CM | POA: Diagnosis not present

## 2023-12-26 DIAGNOSIS — L218 Other seborrheic dermatitis: Secondary | ICD-10-CM | POA: Diagnosis not present

## 2023-12-26 DIAGNOSIS — Z85828 Personal history of other malignant neoplasm of skin: Secondary | ICD-10-CM | POA: Diagnosis not present

## 2023-12-26 DIAGNOSIS — L648 Other androgenic alopecia: Secondary | ICD-10-CM | POA: Diagnosis not present

## 2023-12-27 DIAGNOSIS — E538 Deficiency of other specified B group vitamins: Secondary | ICD-10-CM | POA: Diagnosis not present

## 2024-01-04 DIAGNOSIS — E119 Type 2 diabetes mellitus without complications: Secondary | ICD-10-CM | POA: Diagnosis not present

## 2024-01-31 DIAGNOSIS — Z299 Encounter for prophylactic measures, unspecified: Secondary | ICD-10-CM | POA: Diagnosis not present

## 2024-01-31 DIAGNOSIS — E119 Type 2 diabetes mellitus without complications: Secondary | ICD-10-CM | POA: Diagnosis not present

## 2024-01-31 DIAGNOSIS — I10 Essential (primary) hypertension: Secondary | ICD-10-CM | POA: Diagnosis not present

## 2024-01-31 DIAGNOSIS — E7800 Pure hypercholesterolemia, unspecified: Secondary | ICD-10-CM | POA: Diagnosis not present

## 2024-01-31 DIAGNOSIS — E539 Vitamin B deficiency, unspecified: Secondary | ICD-10-CM | POA: Diagnosis not present

## 2024-01-31 DIAGNOSIS — E538 Deficiency of other specified B group vitamins: Secondary | ICD-10-CM | POA: Diagnosis not present

## 2024-02-04 DIAGNOSIS — E119 Type 2 diabetes mellitus without complications: Secondary | ICD-10-CM | POA: Diagnosis not present

## 2024-03-11 ENCOUNTER — Encounter (INDEPENDENT_AMBULATORY_CARE_PROVIDER_SITE_OTHER): Payer: Self-pay | Admitting: Gastroenterology
